# Patient Record
Sex: Female | Born: 1965
Health system: Southern US, Community
[De-identification: ages and names within clinical notes are randomized; demographics above are authoritative.]

## PROBLEM LIST (undated history)

## (undated) DIAGNOSIS — R87613 High grade squamous intraepithelial lesion on cytologic smear of cervix (HGSIL): Secondary | ICD-10-CM

## (undated) DIAGNOSIS — Z800 Family history of malignant neoplasm of digestive organs: Secondary | ICD-10-CM

## (undated) DIAGNOSIS — N879 Dysplasia of cervix uteri, unspecified: Secondary | ICD-10-CM

## (undated) DIAGNOSIS — O020 Blighted ovum and nonhydatidiform mole: Secondary | ICD-10-CM

## (undated) DIAGNOSIS — A6000 Herpesviral infection of urogenital system, unspecified: Secondary | ICD-10-CM

## (undated) DIAGNOSIS — G43909 Migraine, unspecified, not intractable, without status migrainosus: Secondary | ICD-10-CM

## (undated) DIAGNOSIS — Z1371 Encounter for nonprocreative screening for genetic disease carrier status: Secondary | ICD-10-CM

## (undated) DIAGNOSIS — J45909 Unspecified asthma, uncomplicated: Secondary | ICD-10-CM

## (undated) DIAGNOSIS — K219 Gastro-esophageal reflux disease without esophagitis: Secondary | ICD-10-CM

## (undated) DIAGNOSIS — I341 Nonrheumatic mitral (valve) prolapse: Secondary | ICD-10-CM

## (undated) DIAGNOSIS — Z9289 Personal history of other medical treatment: Secondary | ICD-10-CM

## (undated) HISTORY — PX: CERVICAL CERCLAGE: SHX1329

## (undated) HISTORY — DX: Migraine, unspecified, not intractable, without status migrainosus: G43.909

## (undated) HISTORY — DX: High grade squamous intraepithelial lesion on cytologic smear of cervix (HGSIL): R87.613

## (undated) HISTORY — DX: Family history of malignant neoplasm of digestive organs: Z80.0

## (undated) HISTORY — DX: Unspecified asthma, uncomplicated: J45.909

## (undated) HISTORY — DX: Gastro-esophageal reflux disease without esophagitis: K21.9

## (undated) HISTORY — DX: Nonrheumatic mitral (valve) prolapse: I34.1

## (undated) HISTORY — PX: DILATION AND CURETTAGE OF UTERUS: SHX78

## (undated) HISTORY — DX: Herpesviral infection of urogenital system, unspecified: A60.00

## (undated) HISTORY — DX: Blighted ovum and nonhydatidiform mole: O02.0

## (undated) HISTORY — DX: Dysplasia of cervix uteri, unspecified: N87.9

## (undated) HISTORY — DX: Encounter for nonprocreative screening for genetic disease carrier status: Z13.71

## (undated) HISTORY — PX: TUBAL LIGATION: SHX77

## (undated) HISTORY — DX: Personal history of other medical treatment: Z92.89

---

## 1988-07-23 HISTORY — PX: SPINAL FUSION: SHX223

## 1998-07-23 DIAGNOSIS — R87613 High grade squamous intraepithelial lesion on cytologic smear of cervix (HGSIL): Secondary | ICD-10-CM

## 1998-07-23 HISTORY — PX: COLPOSCOPY: SHX161

## 1998-07-23 HISTORY — DX: High grade squamous intraepithelial lesion on cytologic smear of cervix (HGSIL): R87.613

## 1999-07-24 HISTORY — PX: CERVICAL BIOPSY  W/ LOOP ELECTRODE EXCISION: SUR135

## 2004-05-31 ENCOUNTER — Ambulatory Visit: Payer: Self-pay | Admitting: Unknown Physician Specialty

## 2008-08-17 ENCOUNTER — Ambulatory Visit: Payer: Self-pay | Admitting: Unknown Physician Specialty

## 2009-07-23 HISTORY — PX: COLONOSCOPY: SHX174

## 2009-08-18 ENCOUNTER — Ambulatory Visit: Payer: Self-pay | Admitting: Cardiology

## 2009-08-29 ENCOUNTER — Ambulatory Visit: Payer: Self-pay | Admitting: Unknown Physician Specialty

## 2009-10-04 ENCOUNTER — Ambulatory Visit: Payer: Self-pay | Admitting: Unknown Physician Specialty

## 2010-10-10 ENCOUNTER — Ambulatory Visit: Payer: Self-pay | Admitting: Unknown Physician Specialty

## 2011-07-24 DIAGNOSIS — Z800 Family history of malignant neoplasm of digestive organs: Secondary | ICD-10-CM

## 2011-07-24 HISTORY — DX: Family history of malignant neoplasm of digestive organs: Z80.0

## 2011-10-18 ENCOUNTER — Ambulatory Visit: Payer: Self-pay

## 2012-11-06 ENCOUNTER — Ambulatory Visit: Payer: Self-pay | Admitting: Obstetrics and Gynecology

## 2013-11-26 ENCOUNTER — Ambulatory Visit: Payer: Self-pay | Admitting: Obstetrics and Gynecology

## 2014-10-29 ENCOUNTER — Ambulatory Visit
Admit: 2014-10-29 | Disposition: A | Discharge status: Home / Self Care | Payer: Self-pay | Attending: Unknown Physician Specialty | Admitting: Unknown Physician Specialty

## 2014-11-05 ENCOUNTER — Other Ambulatory Visit: Payer: Self-pay | Admitting: Obstetrics and Gynecology

## 2014-11-05 DIAGNOSIS — Z1231 Encounter for screening mammogram for malignant neoplasm of breast: Secondary | ICD-10-CM

## 2014-12-16 ENCOUNTER — Ambulatory Visit
Admission: RE | Admit: 2014-12-16 | Discharge: 2014-12-16 | Disposition: A | Discharge status: Home / Self Care | Payer: 59 | Source: Ambulatory Visit | Attending: Obstetrics and Gynecology | Admitting: Obstetrics and Gynecology

## 2014-12-16 ENCOUNTER — Other Ambulatory Visit: Payer: Self-pay | Admitting: Obstetrics and Gynecology

## 2014-12-16 DIAGNOSIS — Z1231 Encounter for screening mammogram for malignant neoplasm of breast: Secondary | ICD-10-CM | POA: Insufficient documentation

## 2014-12-16 DIAGNOSIS — Z1239 Encounter for other screening for malignant neoplasm of breast: Secondary | ICD-10-CM

## 2014-12-23 DIAGNOSIS — Z9289 Personal history of other medical treatment: Secondary | ICD-10-CM

## 2014-12-23 HISTORY — DX: Personal history of other medical treatment: Z92.89

## 2014-12-23 LAB — HM PAP SMEAR

## 2015-09-02 DIAGNOSIS — H5213 Myopia, bilateral: Secondary | ICD-10-CM | POA: Diagnosis not present

## 2015-09-02 DIAGNOSIS — H52221 Regular astigmatism, right eye: Secondary | ICD-10-CM | POA: Diagnosis not present

## 2015-09-02 DIAGNOSIS — H524 Presbyopia: Secondary | ICD-10-CM | POA: Diagnosis not present

## 2016-01-16 ENCOUNTER — Other Ambulatory Visit: Payer: Self-pay | Admitting: Obstetrics and Gynecology

## 2016-02-02 DIAGNOSIS — Z01419 Encounter for gynecological examination (general) (routine) without abnormal findings: Secondary | ICD-10-CM | POA: Diagnosis not present

## 2016-02-02 DIAGNOSIS — Z315 Encounter for genetic counseling: Secondary | ICD-10-CM | POA: Diagnosis not present

## 2016-02-02 DIAGNOSIS — Z1239 Encounter for other screening for malignant neoplasm of breast: Secondary | ICD-10-CM | POA: Diagnosis not present

## 2016-02-02 DIAGNOSIS — Z8742 Personal history of other diseases of the female genital tract: Secondary | ICD-10-CM | POA: Diagnosis not present

## 2016-02-06 ENCOUNTER — Other Ambulatory Visit: Payer: Self-pay | Admitting: Obstetrics and Gynecology

## 2016-02-06 DIAGNOSIS — Z1231 Encounter for screening mammogram for malignant neoplasm of breast: Secondary | ICD-10-CM

## 2016-02-22 ENCOUNTER — Other Ambulatory Visit: Payer: Self-pay | Admitting: Obstetrics and Gynecology

## 2016-02-22 ENCOUNTER — Ambulatory Visit
Admission: RE | Admit: 2016-02-22 | Discharge: 2016-02-22 | Disposition: A | Discharge status: Home / Self Care | Payer: 59 | Source: Ambulatory Visit | Attending: Obstetrics and Gynecology | Admitting: Obstetrics and Gynecology

## 2016-02-22 DIAGNOSIS — Z1231 Encounter for screening mammogram for malignant neoplasm of breast: Secondary | ICD-10-CM | POA: Insufficient documentation

## 2016-02-22 LAB — HM MAMMOGRAPHY

## 2016-04-05 DIAGNOSIS — Z0000 Encounter for general adult medical examination without abnormal findings: Secondary | ICD-10-CM | POA: Diagnosis not present

## 2016-04-05 DIAGNOSIS — K219 Gastro-esophageal reflux disease without esophagitis: Secondary | ICD-10-CM | POA: Diagnosis not present

## 2016-04-05 DIAGNOSIS — Z800 Family history of malignant neoplasm of digestive organs: Secondary | ICD-10-CM | POA: Diagnosis not present

## 2016-04-05 DIAGNOSIS — G43109 Migraine with aura, not intractable, without status migrainosus: Secondary | ICD-10-CM | POA: Diagnosis not present

## 2016-07-23 HISTORY — PX: REFRACTIVE SURGERY: SHX103

## 2017-02-04 ENCOUNTER — Ambulatory Visit: Payer: Self-pay | Admitting: Obstetrics and Gynecology

## 2017-02-05 ENCOUNTER — Encounter: Payer: Self-pay | Admitting: Obstetrics and Gynecology

## 2017-02-05 ENCOUNTER — Ambulatory Visit (INDEPENDENT_AMBULATORY_CARE_PROVIDER_SITE_OTHER): Payer: 59 | Admitting: Obstetrics and Gynecology

## 2017-02-05 VITALS — BP 100/64 | HR 70 | Ht 60.0 in | Wt 127.0 lb

## 2017-02-05 DIAGNOSIS — Z1151 Encounter for screening for human papillomavirus (HPV): Secondary | ICD-10-CM | POA: Diagnosis not present

## 2017-02-05 DIAGNOSIS — A6000 Herpesviral infection of urogenital system, unspecified: Secondary | ICD-10-CM | POA: Insufficient documentation

## 2017-02-05 DIAGNOSIS — A6004 Herpesviral vulvovaginitis: Secondary | ICD-10-CM | POA: Diagnosis not present

## 2017-02-05 DIAGNOSIS — Z124 Encounter for screening for malignant neoplasm of cervix: Secondary | ICD-10-CM | POA: Diagnosis not present

## 2017-02-05 DIAGNOSIS — Z01419 Encounter for gynecological examination (general) (routine) without abnormal findings: Secondary | ICD-10-CM | POA: Diagnosis not present

## 2017-02-05 DIAGNOSIS — Z1231 Encounter for screening mammogram for malignant neoplasm of breast: Secondary | ICD-10-CM

## 2017-02-05 DIAGNOSIS — Z1239 Encounter for other screening for malignant neoplasm of breast: Secondary | ICD-10-CM

## 2017-02-05 MED ORDER — ACYCLOVIR 400 MG PO TABS: 400.0000 mg | ORAL_TABLET | Freq: Three times a day (TID) | ORAL | 0 refills | Status: DC

## 2017-02-05 NOTE — Progress Notes (Signed)
Chief Complaint  Patient presents with  . Gynecologic Exam     HPI:      Meghan Green is a 51 y.o. [id number] who LMP was Patient's last menstrual period was 01/18/2017., presents today for her annual examination.  Her menses are Q3-4 wks, lasting 3 days.  Dysmenorrhea mild, occurring first 1-2 days of flow. She does not have intermenstrual bleeding.  Sex activity: single partner, contraception - tubal ligation.  Last Pap: December 23, 2014  Results were: no abnormalities /neg HPV DNA . 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 Hx of STDs: HSV, HPV. She takes acyclovir prn outbreaks which are rare.   Last mammogram: February 22, 2016  Results were: normal--routine follow-up in 12 months There is no FH of breast cancer. There is no FH of ovarian cancer. The patient does do self-breast exams. She has a strong FH of colon cancer in 3rd degree relatives on mat and pat side. She gets colonoscopies Q73yrs with Dr. Mechele Collin.  Tobacco use: The patient denies current or previous tobacco use. Alcohol use: none Exercise: moderately active  She does get adequate calcium and Vitamin D in her diet.  She has labs with PCP.   Past Medical History:  Diagnosis Date  . Asthma   . Blighted ovum    X48  . Cervical dysplasia    CIN II  . Family history of colon cancer 2013   STRONG FH ON BOTH SIDES. COLARIS TESTING REC. PT NEVER CAME FOR LAB DRAW  . GERD (gastroesophageal reflux disease)   . Herpes, genital   . History of mammogram 12/16/14; 02/22/16   BIRAD 1; NEG  . History of Papanicolaou smear of cervix 12/23/2014   -/-  . Migraine   . Mitral valve prolapse   . Pap smear abnormality of cervix with HGSIL 2000    Past Surgical History:  Procedure Laterality Date  . CERVICAL BIOPSY  W/ LOOP ELECTRODE EXCISION  2001   mod dysp  . CERVICAL CERCLAGE     x2  . CESAREAN SECTION  2000  . COLONOSCOPY  2011   wnl; repeat due 2015 c Dr. Mechele Collin  . COLPOSCOPY  2000  . DILATION AND CURETTAGE OF UTERUS     x2  . REFRACTIVE SURGERY  07/2016   Right eye  . SPINAL FUSION  1990  . TUBAL LIGATION      Family History  Problem Relation Age of Onset  . Cancer Other 60       4-5 [facility]GA/[facility] COLON  . Cancer Other 43       COLON    Social History   Social History  . Marital status: Married    Spouse name: N/A  . Number of children: 2  . Years of education: 14   Occupational History  . Not on file.   Social History Main Topics  . Smoking status: Never Smoker  . Smokeless tobacco: Never Used  . Alcohol use No  . Drug use: No  . Sexual activity: Yes    Birth control/ protection: Surgical     Comment: TUBAL LIGATION   Other Topics Concern  . Not on file   Social History Narrative  . No narrative on file     Current Outpatient Prescriptions:  .  acetaminophen (TYLENOL) 325 MG tablet, Take 650 mg by mouth every 4 (four) hours as needed., Disp: , Rfl:  .  acyclovir (ZOVIRAX) 400 MG tablet, Take 1 tablet (400 mg total) by  mouth 3 (three) times daily. FOR 5 DAYS PRN SXS, Disp: 30 tablet, Rfl: 0 .  ibuprofen (ADVIL,MOTRIN) 200 MG tablet, Take 200 mg by mouth every 6 (six) hours as needed., Disp: , Rfl:  .  Multiple Vitamin (MULTIVITAMIN) capsule, Take 1 capsule by mouth daily., Disp: , Rfl:  .  naproxen sodium (ANAPROX) 220 MG tablet, Take 1 tablet by mouth as needed., Disp: , Rfl:  .  omeprazole (PRILOSEC) 20 MG capsule, Take 1 capsule by mouth daily., Disp: , Rfl:   ROS:  Review of Systems  Constitutional: Negative for fatigue, fever and unexpected weight change.  Respiratory: Negative for cough, shortness of breath and wheezing.   Cardiovascular: Negative for chest pain, palpitations and leg swelling.  Gastrointestinal: Negative for blood in stool, constipation, diarrhea, nausea and vomiting.  Endocrine: Negative for cold intolerance, heat intolerance and polyuria.  Genitourinary: Negative for dyspareunia, dysuria, flank pain, frequency, genital sores, hematuria, menstrual problem,  pelvic pain, urgency, vaginal bleeding, vaginal discharge and vaginal pain.  Musculoskeletal: Negative for back pain, joint swelling and myalgias.  Skin: Negative for rash.  Neurological: Positive for headaches. Negative for dizziness, syncope, light-headedness and numbness.  Hematological: Negative for adenopathy.  Psychiatric/Behavioral: Negative for agitation, confusion, sleep disturbance and suicidal ideas. The patient is not nervous/anxious.      Objective: BP 100/64 (BP Location: Left Arm, Patient Position: Sitting, Cuff Size: Normal)   Pulse 70   Ht 5' (1.524 m)   Wt 127 lb (57.6 kg)   LMP 01/18/2017   BMI 24.80 kg/m    Physical Exam  Constitutional: She is oriented to person, place, and time. She appears well-developed and well-nourished.  Genitourinary: Vagina normal and uterus normal. There is no rash or tenderness on the right labia. There is no rash or tenderness on the left labia. No erythema or tenderness in the vagina. No vaginal discharge found. Right adnexum does not display mass and does not display tenderness. Left adnexum does not display mass and does not display tenderness. Cervix does not exhibit motion tenderness or polyp. Uterus is not enlarged or tender.  Neck: Normal range of motion. No thyromegaly present.  Cardiovascular: Normal rate, regular rhythm and normal heart sounds.   No murmur heard. Pulmonary/Chest: Effort normal and breath sounds normal. Right breast exhibits no mass, no nipple discharge, no skin change and no tenderness. Left breast exhibits no mass, no nipple discharge, no skin change and no tenderness.  Abdominal: Soft. There is no tenderness. There is no guarding.  Musculoskeletal: Normal range of motion.  Neurological: She is alert and oriented to person, place, and time. No cranial nerve deficit.  Psychiatric: She has a normal mood and affect. Her behavior is normal.  Vitals reviewed.   Assessment/Plan: Encounter for annual routine  gynecological examination  Screening for breast cancer - [facility] - Plan: MM DIGITAL SCREENING BILATERAL, IGP, Aptima HPV  Cervical cancer screening - Plan: IGP, Aptima HPV  Screening for HPV (human papillomavirus)  Herpes simplex vulvovaginitis - Rx acyclovir prn sx. Rarely has outbreak. - Plan: acyclovir (ZOVIRAX) 400 MG tablet             GYN counsel mammography screening, adequate intake of calcium and vitamin D, diet and exercise     F/U  Return in about 1 year (around 02/05/2018).  Medora Roorda B. Jodey Burbano, PA-C 02/05/2017 9:32 AM

## 2017-02-09 LAB — IGP, APTIMA HPV
HPV Aptima: NEGATIVE
PAP SMEAR COMMENT: 0

## 2017-02-28 ENCOUNTER — Ambulatory Visit
Admission: RE | Admit: 2017-02-28 | Discharge: 2017-02-28 | Disposition: A | Discharge status: Home / Self Care | Payer: 59 | Source: Ambulatory Visit | Attending: Obstetrics and Gynecology | Admitting: Obstetrics and Gynecology

## 2017-02-28 DIAGNOSIS — Z1231 Encounter for screening mammogram for malignant neoplasm of breast: Secondary | ICD-10-CM | POA: Diagnosis not present

## 2017-02-28 DIAGNOSIS — Z1239 Encounter for other screening for malignant neoplasm of breast: Secondary | ICD-10-CM

## 2017-03-01 ENCOUNTER — Encounter: Payer: Self-pay | Admitting: Obstetrics and Gynecology

## 2017-03-07 ENCOUNTER — Other Ambulatory Visit: Payer: Self-pay | Admitting: Obstetrics and Gynecology

## 2017-03-07 DIAGNOSIS — A6004 Herpesviral vulvovaginitis: Secondary | ICD-10-CM

## 2017-03-08 ENCOUNTER — Encounter: Payer: Self-pay | Admitting: Obstetrics and Gynecology

## 2017-03-08 ENCOUNTER — Other Ambulatory Visit: Payer: Self-pay | Admitting: Obstetrics and Gynecology

## 2017-03-08 DIAGNOSIS — A6004 Herpesviral vulvovaginitis: Secondary | ICD-10-CM

## 2017-03-08 MED ORDER — ACYCLOVIR 400 MG PO TABS: 400.0000 mg | ORAL_TABLET | Freq: Three times a day (TID) | ORAL | 0 refills | Status: DC

## 2017-04-09 DIAGNOSIS — G43109 Migraine with aura, not intractable, without status migrainosus: Secondary | ICD-10-CM | POA: Diagnosis not present

## 2017-04-09 DIAGNOSIS — G4726 Circadian rhythm sleep disorder, shift work type: Secondary | ICD-10-CM | POA: Diagnosis not present

## 2017-04-09 DIAGNOSIS — Z0000 Encounter for general adult medical examination without abnormal findings: Secondary | ICD-10-CM | POA: Diagnosis not present

## 2017-04-09 DIAGNOSIS — R0789 Other chest pain: Secondary | ICD-10-CM | POA: Diagnosis not present

## 2018-01-03 DIAGNOSIS — H5212 Myopia, left eye: Secondary | ICD-10-CM | POA: Diagnosis not present

## 2018-01-20 DIAGNOSIS — Z1371 Encounter for nonprocreative screening for genetic disease carrier status: Secondary | ICD-10-CM

## 2018-01-20 HISTORY — DX: Encounter for nonprocreative screening for genetic disease carrier status: Z13.71

## 2018-02-10 ENCOUNTER — Ambulatory Visit (INDEPENDENT_AMBULATORY_CARE_PROVIDER_SITE_OTHER): Payer: 59 | Admitting: Obstetrics and Gynecology

## 2018-02-10 ENCOUNTER — Encounter: Payer: Self-pay | Admitting: Family Medicine

## 2018-02-10 ENCOUNTER — Encounter: Payer: Self-pay | Admitting: Obstetrics and Gynecology

## 2018-02-10 VITALS — BP 122/70 | HR 67 | Ht 60.0 in | Wt 116.5 lb

## 2018-02-10 DIAGNOSIS — Z1239 Encounter for other screening for malignant neoplasm of breast: Secondary | ICD-10-CM

## 2018-02-10 DIAGNOSIS — Z1231 Encounter for screening mammogram for malignant neoplasm of breast: Secondary | ICD-10-CM | POA: Diagnosis not present

## 2018-02-10 DIAGNOSIS — Z1321 Encounter for screening for nutritional disorder: Secondary | ICD-10-CM | POA: Diagnosis not present

## 2018-02-10 DIAGNOSIS — Z800 Family history of malignant neoplasm of digestive organs: Secondary | ICD-10-CM | POA: Diagnosis not present

## 2018-02-10 DIAGNOSIS — Z01411 Encounter for gynecological examination (general) (routine) with abnormal findings: Secondary | ICD-10-CM | POA: Diagnosis not present

## 2018-02-10 DIAGNOSIS — Z01419 Encounter for gynecological examination (general) (routine) without abnormal findings: Secondary | ICD-10-CM

## 2018-02-10 DIAGNOSIS — A6004 Herpesviral vulvovaginitis: Secondary | ICD-10-CM

## 2018-02-10 MED ORDER — ACYCLOVIR 400 MG PO TABS: 400.0000 mg | ORAL_TABLET | Freq: Three times a day (TID) | ORAL | 1 refills | Status: DC

## 2018-02-10 NOTE — Progress Notes (Signed)
Chief Complaint  Patient presents with  . Gynecologic Exam     HPI:      [name] is a 52 y.o. [id number] who LMP was Patient's last menstrual period was 01/11/2018 (exact date)., presents today for her annual examination.  Her menses are Q3-4 wks, lasting 3 days.  Dysmenorrhea mild, occurring day before flow. She does not have intermenstrual bleeding.  Sex activity: single partner, contraception - tubal ligation.  Last Pap: 02/05/17  Results were: no abnormalities /neg HPV DNA . 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 Hx of STDs: HSV, HPV. She takes acyclovir prn outbreaks which are rare. She needs Rx RF.  Last mammogram: February 28, 2017  Results were: normal--routine follow-up in 12 months There is no FH of breast cancer. There is no FH of ovarian cancer. The patient does do self-breast exams. She has a strong FH of colon cancer in 3rd degree relatives on mat and pat side. She gets colonoscopies Q6467yrs with Dr. Mechele CollinElliott and is due soon. Genetic testing discussed in past but not done due to ins coverage.   Tobacco use: The patient denies current or previous tobacco use. Alcohol use: none Exercise: moderately active  She does get adequate calcium but not Vitamin D in her diet.  She has labs with PCP.   Past Medical History:  Diagnosis Date  . Asthma   . Blighted ovum    59X2  . Cervical dysplasia    CIN II  . Family history of colon cancer 2013   STRONG FH ON BOTH SIDES. COLARIS TESTING REC. PT NEVER CAME FOR LAB DRAW  . GERD (gastroesophageal reflux disease)   . Herpes, genital   . History of mammogram 12/16/14; 02/22/16   BIRAD 1; NEG  . History of Papanicolaou smear of cervix 12/23/2014   -/-  . Migraine   . Mitral valve prolapse   . Pap smear abnormality of cervix with HGSIL 2000    Past Surgical History:  Procedure Laterality Date  . CERVICAL BIOPSY  W/ LOOP ELECTRODE EXCISION  2001   mod dysp  . CERVICAL CERCLAGE     x2  . CESAREAN SECTION  2000  .  COLONOSCOPY  2011   wnl; repeat due 2015 c Dr. Mechele CollinElliott  . COLPOSCOPY  2000  . DILATION AND CURETTAGE OF UTERUS     x2  . REFRACTIVE SURGERY  07/2016   Right eye  . SPINAL FUSION  1990  . TUBAL LIGATION      Family History  Problem Relation Age of Onset  . Colon cancer Other 51       3 mat relatives  . Colon cancer Other 70       COLON  . Breast cancer Neg Hx     Social History   Socioeconomic History  . Marital status: Married    Spouse name: Not on file  . Number of children: 2  . Years of education: 7014  . Highest education level: Not on file  Occupational History  . Not on file  Social Needs  . Financial resource strain: Not on file  . Food insecurity:    Worry: Not on file    Inability: Not on file  . Transportation needs:    Medical: Not on file    Non-medical: Not on file  Tobacco Use  . Smoking status: Never Smoker  . Smokeless tobacco: Never Used  Substance and Sexual Activity  . Alcohol use: No  .  Drug use: No  . Sexual activity: Yes    Birth control/protection: Surgical    Comment: TUBAL LIGATION  Lifestyle  . Physical activity:    Days per week: Not on file    Minutes per session: Not on file  . Stress: Not on file  Relationships  . Social connections:    Talks on phone: Not on file    Gets together: Not on file    Attends religious service: Not on file    Active member of club or organization: Not on file    Attends meetings of clubs or organizations: Not on file    Relationship status: Not on file  . Intimate partner violence:    Fear of current or ex partner: Not on file    Emotionally abused: Not on file    Physically abused: Not on file    Forced sexual activity: Not on file  Other Topics Concern  . Not on file  Social History Narrative  . Not on file    Current Outpatient Medications on File Prior to Visit  Medication Sig Dispense Refill  . aspirin-acetaminophen-caffeine (EXCEDRIN MIGRAINE) 250-250-65 MG tablet     .  bimatoprost (LATISSE) 0.03 % ophthalmic solution   6  . ibuprofen (ADVIL,MOTRIN) 200 MG tablet Take 200 mg by mouth every 6 (six) hours as needed.    . Multiple Vitamin (MULTIVITAMIN) capsule Take 1 capsule by mouth daily.    . naproxen sodium (ANAPROX) 220 MG tablet Take 1 tablet by mouth as needed.    Marland Kitchen omeprazole (PRILOSEC) 20 MG capsule Take 1 capsule by mouth daily.     No current facility-administered medications on file prior to visit.     ROS:  Review of Systems  Constitutional: Negative for fatigue, fever and unexpected weight change.  Respiratory: Negative for cough, shortness of breath and wheezing.   Cardiovascular: Negative for chest pain, palpitations and leg swelling.  Gastrointestinal: Negative for blood in stool, constipation, diarrhea, nausea and vomiting.  Endocrine: Negative for cold intolerance, heat intolerance and polyuria.  Genitourinary: Negative for dyspareunia, dysuria, flank pain, frequency, genital sores, hematuria, menstrual problem, pelvic pain, urgency, vaginal bleeding, vaginal discharge and vaginal pain.  Musculoskeletal: Negative for back pain, joint swelling and myalgias.  Skin: Negative for rash.  Neurological: Negative for dizziness, syncope, light-headedness, numbness and headaches.  Hematological: Negative for adenopathy.  Psychiatric/Behavioral: Negative for agitation, confusion, sleep disturbance and suicidal ideas. The patient is not nervous/anxious.      Objective: BP 122/70   Pulse 67   Ht 5' (1.524 m)   Wt 116 lb 8 oz (52.8 kg)   LMP 01/11/2018 (Exact Date)   BMI 22.75 kg/m    Physical Exam  Constitutional: She is oriented to person, place, and time. She appears well-developed and well-nourished.  Genitourinary: Vagina normal and uterus normal. There is no rash or tenderness on the right labia. There is no rash or tenderness on the left labia. No erythema or tenderness in the vagina. No vaginal discharge found. Right adnexum does not  display mass and does not display tenderness. Left adnexum does not display mass and does not display tenderness. Cervix does not exhibit motion tenderness or polyp. Uterus is not enlarged or tender.  Neck: Normal range of motion. No thyromegaly present.  Cardiovascular: Normal rate, regular rhythm and normal heart sounds.  No murmur heard. Pulmonary/Chest: Effort normal and breath sounds normal. Right breast exhibits no mass, no nipple discharge, no skin change and no tenderness. Left  breast exhibits no mass, no nipple discharge, no skin change and no tenderness.  Abdominal: Soft. There is no tenderness. There is no guarding.  Musculoskeletal: Normal range of motion.  Neurological: She is alert and oriented to person, place, and time. No cranial nerve deficit.  Psychiatric: She has a normal mood and affect. Her behavior is normal.  Vitals reviewed.   Assessment/Plan: Encounter for annual routine gynecological examination  Screening for breast cancer - Pt to sched mammo. - Plan: MM DIGITAL SCREENING BILATERAL  Family history of colon cancer - MyRisk testing discussed and done today. Will call with results. Getting colonoscopies Q5 yrs and due soon per pt. Pt to call [facility]. Will do ref prn - Plan: [facility]  Encounter for vitamin deficiency screening - Plan: VITAMIN D 25 Hydroxy (Vit-D Deficiency, Fractures)  Herpes simplex vulvovaginitis - Rx acyclovir prn sx. Rarely has outbreak.  Meds ordered this encounter  Medications  . acyclovir (ZOVIRAX) 400 MG tablet    Sig: Take 1 tablet (400 mg total) by mouth 3 (three) times daily. FOR 5 DAYS PRN SXS    Dispense:  30 tablet    Refill:  1    Order Specific Question:   Supervising Provider    Answer:   Nadara Mustard [[id number]]             GYN counsel breast self exam, mammography screening, adequate intake of calcium and vitamin D, diet and exercise     F/U  Return in about 1 year (around 02/11/2019).  Alicia B. Copland,  PA-C 02/10/2018 9:36 AM

## 2018-02-10 NOTE — Patient Instructions (Addendum)
I value your feedback and entrusting us with your care. If you get a [facility] patient survey, I would appreciate you taking the time to let us know about your experience today. Thank you!  [facility]: [phone]    

## 2018-02-11 LAB — VITAMIN D 25 HYDROXY (VIT D DEFICIENCY, FRACTURES): VIT D 25 HYDROXY: 37.2 ng/mL (ref 30.0–100.0)

## 2018-03-13 ENCOUNTER — Ambulatory Visit
Admission: RE | Admit: 2018-03-13 | Discharge: 2018-03-13 | Disposition: A | Discharge status: Home / Self Care | Payer: 59 | Source: Ambulatory Visit | Attending: Obstetrics and Gynecology | Admitting: Obstetrics and Gynecology

## 2018-03-13 ENCOUNTER — Encounter: Payer: Self-pay | Admitting: Obstetrics and Gynecology

## 2018-03-13 DIAGNOSIS — Z1239 Encounter for other screening for malignant neoplasm of breast: Secondary | ICD-10-CM

## 2018-03-13 DIAGNOSIS — Z1231 Encounter for screening mammogram for malignant neoplasm of breast: Secondary | ICD-10-CM | POA: Insufficient documentation

## 2018-03-17 ENCOUNTER — Encounter: Payer: Self-pay | Admitting: Obstetrics and Gynecology

## 2018-03-25 ENCOUNTER — Telehealth: Payer: Self-pay | Admitting: Obstetrics and Gynecology

## 2018-03-25 ENCOUNTER — Encounter: Payer: Self-pay | Admitting: Obstetrics and Gynecology

## 2018-03-25 NOTE — Telephone Encounter (Signed)
Patient is calling for lab results. Patient works third shift and will be up around 4:45 if you need to speak with patient. Patient states it is ok to leave an detailed voicemail

## 2018-03-26 ENCOUNTER — Telehealth: Payer: Self-pay | Admitting: Obstetrics and Gynecology

## 2018-03-26 NOTE — Telephone Encounter (Signed)
LMTRC

## 2018-03-26 NOTE — Telephone Encounter (Signed)
Pt aware of neg MyRisk results, except APC VUS. Riskscore=9.6%. Strong FH colon cancer. No extra screening needed. Followed by Dr. Mechele Collin.   Patient understands these results only apply to her and her children, and this is not indicative of genetic testing results of her other family members. It is recommended that her other family members have genetic testing done.  Pt also understands negative genetic testing doesn't mean she will never get any of these cancers.   Hard copy mailed to pt. F/u prn.

## 2018-03-26 NOTE — Telephone Encounter (Signed)
Done

## 2018-03-26 NOTE — Telephone Encounter (Signed)
Patient is returning missed call. Please advise 

## 2018-04-15 DIAGNOSIS — Z79899 Other long term (current) drug therapy: Secondary | ICD-10-CM | POA: Diagnosis not present

## 2018-04-15 DIAGNOSIS — K219 Gastro-esophageal reflux disease without esophagitis: Secondary | ICD-10-CM | POA: Diagnosis not present

## 2018-04-15 DIAGNOSIS — G43109 Migraine with aura, not intractable, without status migrainosus: Secondary | ICD-10-CM | POA: Diagnosis not present

## 2018-04-15 DIAGNOSIS — Z0000 Encounter for general adult medical examination without abnormal findings: Secondary | ICD-10-CM | POA: Diagnosis not present

## 2018-08-05 ENCOUNTER — Other Ambulatory Visit: Payer: Self-pay | Admitting: Obstetrics and Gynecology

## 2018-08-05 NOTE — Telephone Encounter (Signed)
Please advise 

## 2019-01-09 DIAGNOSIS — H5212 Myopia, left eye: Secondary | ICD-10-CM | POA: Diagnosis not present

## 2019-02-16 NOTE — Progress Notes (Signed)
Chief Complaint  Patient presents with  . Gynecologic Exam     HPI:      [name] is a 53 y.o. [id number] who LMP was Patient's last menstrual period was 01/30/2019 (exact date)., presents today for her annual examination.  Her menses are Q3-4 wks, lasting 3 days.  Dysmenorrhea none. She does not have intermenstrual bleeding. No vasomotor sx. Having increased menstrual migraines, which is common in her family until menopause.   Sex activity: single partner, contraception - tubal ligation.  Last Pap: 02/05/17  Results were: no abnormalities /neg HPV DNA . 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 Hx of STDs: HSV, HPV. She takes acyclovir prn outbreaks which are rare. She needs Rx RF.  Last mammogram: March 13, 2018  Results were: normal--routine follow-up in 12 months There is no FH of breast cancer. There is no FH of ovarian cancer. The patient does do self-breast exams. She has a strong FH of colon cancer in 3rd degree relatives on mat and pat side. She gets colonoscopies Q51yr with Dr. EVira Agarand is due 2021. MyRisk neg except APC VUS 2019. Riskscore=9.6%.  Tobacco use: The patient denies current or previous tobacco use. Alcohol use: none Drug use: none Exercise: moderately active  She does get adequate calcium and Vitamin D in her diet.  She has labs with PCP.   Past Medical History:  Diagnosis Date  . Asthma   . Blighted ovum    XX33 . BRCA negative 01/2018   MyRisk neg except APC VUS; IBIS=9.7%, riskscore=9.6%  . Cervical dysplasia    CIN II  . Family history of colon cancer 2013   STRONG FH ON BOTH SIDES. COLARIS TESTING REC. PT NEVER CAME FOR LAB DRAW  . GERD (gastroesophageal reflux disease)   . Herpes, genital   . History of mammogram 12/16/14; 02/22/16   BIRAD 1; NEG  . History of Papanicolaou smear of cervix 12/23/2014   -/-  . Migraine   . Mitral valve prolapse   . Pap smear abnormality of cervix with HGSIL 2000    Past Surgical History:   Procedure Laterality Date  . CERVICAL BIOPSY  W/ LOOP ELECTRODE EXCISION  2001   mod dysp  . CERVICAL CERCLAGE     x2  . CESAREAN SECTION  2000  . COLONOSCOPY  2011   wnl; repeat due 2015 c Dr. EVira Agar . COLPOSCOPY  2000  . DILATION AND CURETTAGE OF UTERUS     x2  . REFRACTIVE SURGERY  07/2016   Right eye  . SPINAL FUSION  1990  . TUBAL LIGATION      Family History  Problem Relation Age of Onset  . Colon cancer Other 573      3 mat relatives  . Colon cancer Other 70       COLON  . Breast cancer Neg Hx     Social History   Socioeconomic History  . Marital status: Married    Spouse name: Not on file  . Number of children: 2  . Years of education: 156 . Highest education level: Not on file  Occupational History  . Not on file  Social Needs  . Financial resource strain: Not on file  . Food insecurity    Worry: Not on file    Inability: Not on file  . Transportation needs    Medical: Not on file    Non-medical: Not on file  Tobacco Use  .  Smoking status: Never Smoker  . Smokeless tobacco: Never Used  Substance and Sexual Activity  . Alcohol use: No  . Drug use: No  . Sexual activity: Yes    Birth control/protection: Surgical    Comment: TUBAL LIGATION  Lifestyle  . Physical activity    Days per week: Not on file    Minutes per session: Not on file  . Stress: Not on file  Relationships  . Social Herbalist on phone: Not on file    Gets together: Not on file    Attends religious service: Not on file    Active member of club or organization: Not on file    Attends meetings of clubs or organizations: Not on file    Relationship status: Not on file  . Intimate partner violence    Fear of current or ex partner: Not on file    Emotionally abused: Not on file    Physically abused: Not on file    Forced sexual activity: Not on file  Other Topics Concern  . Not on file  Social History Narrative  . Not on file    Current Outpatient Medications  on File Prior to Visit  Medication Sig Dispense Refill  . aspirin-acetaminophen-caffeine (EXCEDRIN MIGRAINE) 250-250-65 MG tablet     . bimatoprost (LATISSE) 0.03 % ophthalmic solution   6  . Calcium-Vitamin D-Vitamin K 336-122-44 MG-UNT-MCG CHEW Chew by mouth.    Marland Kitchen ibuprofen (ADVIL,MOTRIN) 200 MG tablet Take 200 mg by mouth every 6 (six) hours as needed.    . Multiple Vitamin (MULTIVITAMIN) capsule Take 1 capsule by mouth daily.    . naproxen sodium (ANAPROX) 220 MG tablet Take 1 tablet by mouth as needed.    Marland Kitchen omeprazole (PRILOSEC) 20 MG capsule Take 1 capsule by mouth daily.     No current facility-administered medications on file prior to visit.     ROS:  Review of Systems  Constitutional: Negative for fatigue, fever and unexpected weight change.  Respiratory: Negative for cough, shortness of breath and wheezing.   Cardiovascular: Negative for chest pain, palpitations and leg swelling.  Gastrointestinal: Negative for blood in stool, constipation, diarrhea, nausea and vomiting.  Endocrine: Negative for cold intolerance, heat intolerance and polyuria.  Genitourinary: Negative for dyspareunia, dysuria, flank pain, frequency, genital sores, hematuria, menstrual problem, pelvic pain, urgency, vaginal bleeding, vaginal discharge and vaginal pain.  Musculoskeletal: Positive for arthralgias. Negative for back pain, joint swelling and myalgias.  Skin: Negative for rash.  Neurological: Positive for headaches. Negative for dizziness, syncope, light-headedness and numbness.  Hematological: Negative for adenopathy.  Psychiatric/Behavioral: Negative for agitation, confusion, sleep disturbance and suicidal ideas. The patient is not nervous/anxious.      Objective: BP 100/78   Ht 5' (1.524 m)   Wt 122 lb 3.2 oz (55.4 kg)   LMP 01/30/2019 (Exact Date)   BMI 23.87 kg/m    Physical Exam Constitutional:      Appearance: She is well-developed.  Genitourinary:     Vulva, vagina, uterus,  right adnexa and left adnexa normal.     No vulval lesion or tenderness noted.     No vaginal discharge, erythema or tenderness.     No cervical motion tenderness or polyp.     Uterus is not enlarged or tender.     No right or left adnexal mass present.     Right adnexa not tender.     Left adnexa not tender.  Neck:  Musculoskeletal: Normal range of motion.     Thyroid: No thyromegaly.  Cardiovascular:     Rate and Rhythm: Normal rate and regular rhythm.     Heart sounds: Normal heart sounds. No murmur.  Pulmonary:     Effort: Pulmonary effort is normal.     Breath sounds: Normal breath sounds.  Chest:     Breasts:        Right: No mass, nipple discharge, skin change or tenderness.        Left: No mass, nipple discharge, skin change or tenderness.  Abdominal:     Palpations: Abdomen is soft.     Tenderness: There is no abdominal tenderness. There is no guarding.  Musculoskeletal: Normal range of motion.  Neurological:     General: No focal deficit present.     Mental Status: She is alert and oriented to person, place, and time.     Cranial Nerves: No cranial nerve deficit.  Skin:    General: Skin is warm and dry.  Psychiatric:        Mood and Affect: Mood normal.        Behavior: Behavior normal.        Thought Content: Thought content normal.        Judgment: Judgment normal.  Vitals signs reviewed.     Assessment/Plan: Encounter for annual routine gynecological examination -   Screening for breast cancer - Plan: MM 3D SCREEN BREAST BILATERAL, Pt to sched mammo  Family history of colon cancer - Plan: Neg MyRisk testing. Getting Q5 yr colonoscopies.  Herpes simplex vulvovaginitis - Plan: acyclovir (ZOVIRAX) 400 MG tablet, Rx RF acyclovir. F/u prn.   Meds ordered this encounter  Medications  . acyclovir (ZOVIRAX) 400 MG tablet    Sig: TAKE 1 TABLET BY MOUTH 3 TIMES DAILY FOR 5 DAYS AS NEEDED FOR SYMPTOMS    Dispense:  30 tablet    Refill:  1    Order Specific  Question:   Supervising Provider    Answer:   Gae Dry [[id number]]             GYN counsel breast self exam, mammography screening, adequate intake of calcium and vitamin D, diet and exercise     F/U  Return in about 1 year (around 02/17/2020).  Lesta Limbert B. Shai Rasmussen, PA-C 02/17/2019 8:50 AM

## 2019-02-17 ENCOUNTER — Ambulatory Visit (INDEPENDENT_AMBULATORY_CARE_PROVIDER_SITE_OTHER): Payer: 59 | Admitting: Obstetrics and Gynecology

## 2019-02-17 ENCOUNTER — Other Ambulatory Visit: Payer: Self-pay

## 2019-02-17 ENCOUNTER — Encounter: Payer: Self-pay | Admitting: Obstetrics and Gynecology

## 2019-02-17 VITALS — BP 100/78 | Ht 60.0 in | Wt 122.2 lb

## 2019-02-17 DIAGNOSIS — A6004 Herpesviral vulvovaginitis: Secondary | ICD-10-CM

## 2019-02-17 DIAGNOSIS — Z01419 Encounter for gynecological examination (general) (routine) without abnormal findings: Secondary | ICD-10-CM | POA: Diagnosis not present

## 2019-02-17 DIAGNOSIS — Z800 Family history of malignant neoplasm of digestive organs: Secondary | ICD-10-CM

## 2019-02-17 DIAGNOSIS — Z1239 Encounter for other screening for malignant neoplasm of breast: Secondary | ICD-10-CM

## 2019-02-17 MED ORDER — ACYCLOVIR 400 MG PO TABS: ORAL_TABLET | ORAL | 1 refills | Status: DC

## 2019-02-17 NOTE — Patient Instructions (Signed)
I value your feedback and entrusting us with your care. If you get a [facility] patient survey, I would appreciate you taking the time to let us know about your experience today. Thank you! 

## 2019-04-17 DIAGNOSIS — G43109 Migraine with aura, not intractable, without status migrainosus: Secondary | ICD-10-CM | POA: Diagnosis not present

## 2019-04-17 DIAGNOSIS — Z23 Encounter for immunization: Secondary | ICD-10-CM | POA: Diagnosis not present

## 2019-04-17 DIAGNOSIS — Z0000 Encounter for general adult medical examination without abnormal findings: Secondary | ICD-10-CM | POA: Diagnosis not present

## 2019-04-17 DIAGNOSIS — K219 Gastro-esophageal reflux disease without esophagitis: Secondary | ICD-10-CM | POA: Diagnosis not present

## 2019-05-25 ENCOUNTER — Encounter: Payer: Self-pay | Admitting: Obstetrics and Gynecology

## 2019-05-25 ENCOUNTER — Ambulatory Visit
Admission: RE | Admit: 2019-05-25 | Discharge: 2019-05-25 | Disposition: A | Discharge status: Home / Self Care | Payer: 59 | Source: Ambulatory Visit | Attending: Obstetrics and Gynecology | Admitting: Obstetrics and Gynecology

## 2019-05-25 DIAGNOSIS — Z1231 Encounter for screening mammogram for malignant neoplasm of breast: Secondary | ICD-10-CM | POA: Insufficient documentation

## 2019-05-25 DIAGNOSIS — Z1239 Encounter for other screening for malignant neoplasm of breast: Secondary | ICD-10-CM

## 2019-07-03 DIAGNOSIS — J22 Unspecified acute lower respiratory infection: Secondary | ICD-10-CM | POA: Diagnosis not present

## 2019-07-03 DIAGNOSIS — G4489 Other headache syndrome: Secondary | ICD-10-CM | POA: Diagnosis not present

## 2019-07-03 DIAGNOSIS — U071 COVID-19: Secondary | ICD-10-CM | POA: Diagnosis not present

## 2020-01-14 DIAGNOSIS — Z01812 Encounter for preprocedural laboratory examination: Secondary | ICD-10-CM | POA: Diagnosis not present

## 2020-01-14 DIAGNOSIS — Z8371 Family history of colonic polyps: Secondary | ICD-10-CM | POA: Diagnosis not present

## 2020-01-14 DIAGNOSIS — H5212 Myopia, left eye: Secondary | ICD-10-CM | POA: Diagnosis not present

## 2020-01-14 DIAGNOSIS — K219 Gastro-esophageal reflux disease without esophagitis: Secondary | ICD-10-CM | POA: Diagnosis not present

## 2020-01-14 DIAGNOSIS — Z8616 Personal history of COVID-19: Secondary | ICD-10-CM | POA: Diagnosis not present

## 2020-02-21 NOTE — Progress Notes (Signed)
Chief Complaint  Patient presents with  . Gynecologic Exam     HPI:      [name] is a 54 y.o. [id number] who LMP was Patient's last menstrual period was 02/13/2020 (exact date)., presents today for her annual examination.  Her menses are Q4 wks, lasting 3 days. Dysmenorrhea minimal. She does not have intermenstrual bleeding. No vasomotor sx.   Sex activity: single partner, contraception - tubal ligation.  Last Pap: 02/05/17  Results were: no abnormalities /neg HPV DNA . 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 Hx of STDs: HSV, HPV. She takes acyclovir prn outbreaks which are rare. She needs Rx RF.  Last mammogram: 05/25/19  Results were: normal--routine follow-up in 12 months There is no FH of breast cancer. There is no FH of ovarian cancer. The patient does do self-breast exams. She has a strong FH of colon cancer in 3rd degree relatives on mat and pat side. She gets colonoscopies Q64yr with Dr. EVira Agarand is due 03/2020. MyRisk neg except APC VUS 2019. Riskscore=9.6%.  Tobacco use: The patient denies current or previous tobacco use. Alcohol use: none Drug use: none Exercise: moderately active  She does get adequate calcium and Vitamin D in her diet.  She has labs with PCP.   Past Medical History:  Diagnosis Date  . Asthma   . Blighted ovum    XX22 . BRCA negative 01/2018   MyRisk neg except APC VUS; IBIS=9.7%, riskscore=9.6%  . Cervical dysplasia    CIN II  . Family history of colon cancer 2013   STRONG FH ON BOTH SIDES. COLARIS TESTING REC. PT NEVER CAME FOR LAB DRAW  . GERD (gastroesophageal reflux disease)   . Herpes, genital   . History of mammogram 12/16/14; 02/22/16   BIRAD 1; NEG  . History of Papanicolaou smear of cervix 12/23/2014   -/-  . Migraine   . Mitral valve prolapse   . Pap smear abnormality of cervix with HGSIL 2000    Past Surgical History:  Procedure Laterality Date  . CERVICAL BIOPSY  W/ LOOP ELECTRODE EXCISION  2001   mod dysp  .  CERVICAL CERCLAGE     x2  . CESAREAN SECTION  2000  . COLONOSCOPY  2011   wnl; repeat due 2015 c Dr. EVira Agar . COLPOSCOPY  2000  . DILATION AND CURETTAGE OF UTERUS     x2  . REFRACTIVE SURGERY  07/2016   Right eye  . SPINAL FUSION  1990  . TUBAL LIGATION      Family History  Problem Relation Age of Onset  . Colon cancer Other 555      3 mat relatives  . Colon cancer Other 70       COLON  . Breast cancer Neg Hx     Social History   Socioeconomic History  . Marital status: Married    Spouse name: Not on file  . Number of children: 2  . Years of education: 151 . Highest education level: Not on file  Occupational History  . Not on file  Tobacco Use  . Smoking status: Never Smoker  . Smokeless tobacco: Never Used  Vaping Use  . Vaping Use: Never used  Substance and Sexual Activity  . Alcohol use: No  . Drug use: No  . Sexual activity: Yes    Birth control/protection: Surgical    Comment: TUBAL LIGATION  Other Topics Concern  . Not on file  Social History Narrative  . Not on file   Social Determinants of Health   Financial Resource Strain:   . Difficulty of Paying Living Expenses:   Food Insecurity:   . Worried About Charity fundraiser in the Last Year:   . Arboriculturist in the Last Year:   Transportation Needs:   . Film/video editor (Medical):   Marland Kitchen Lack of Transportation (Non-Medical):   Physical Activity:   . Days of Exercise per Week:   . Minutes of Exercise per Session:   Stress:   . Feeling of Stress :   Social Connections:   . Frequency of Communication with Friends and Family:   . Frequency of Social Gatherings with Friends and Family:   . Attends Religious Services:   . Active Member of Clubs or Organizations:   . Attends Archivist Meetings:   Marland Kitchen Marital Status:   Intimate Partner Violence:   . Fear of Current or Ex-Partner:   . Emotionally Abused:   Marland Kitchen Physically Abused:   . Sexually Abused:     Current Outpatient  Medications on File Prior to Visit  Medication Sig Dispense Refill  . aspirin-acetaminophen-caffeine (EXCEDRIN MIGRAINE) 250-250-65 MG tablet     . bimatoprost (LATISSE) 0.03 % ophthalmic solution   6  . Calcium-Vitamin D-Vitamin K 528-413-24 MG-UNT-MCG CHEW Chew by mouth.    . diclofenac Sodium (VOLTAREN) 1 % GEL Apply topically.    Marland Kitchen ibuprofen (ADVIL,MOTRIN) 200 MG tablet Take 200 mg by mouth every 6 (six) hours as needed.    . Multiple Vitamin (MULTIVITAMIN) capsule Take 1 capsule by mouth daily.    . naproxen sodium (ANAPROX) 220 MG tablet Take 1 tablet by mouth as needed.    Marland Kitchen omeprazole (PRILOSEC) 20 MG capsule Take 1 capsule by mouth daily.     No current facility-administered medications on file prior to visit.    ROS:  Review of Systems  Constitutional: Negative for fatigue, fever and unexpected weight change.  Respiratory: Negative for cough, shortness of breath and wheezing.   Cardiovascular: Negative for chest pain, palpitations and leg swelling.  Gastrointestinal: Negative for blood in stool, constipation, diarrhea, nausea and vomiting.  Endocrine: Negative for cold intolerance, heat intolerance and polyuria.  Genitourinary: Negative for dyspareunia, dysuria, flank pain, frequency, genital sores, hematuria, menstrual problem, pelvic pain, urgency, vaginal bleeding, vaginal discharge and vaginal pain.  Musculoskeletal: Positive for arthralgias. Negative for back pain, joint swelling and myalgias.  Skin: Negative for rash.  Neurological: Positive for headaches. Negative for dizziness, syncope, light-headedness and numbness.  Hematological: Negative for adenopathy.  Psychiatric/Behavioral: Negative for agitation, confusion, sleep disturbance and suicidal ideas. The patient is not nervous/anxious.      Objective: BP (!) 100/60   Ht 5' (1.524 m)   Wt 118 lb (53.5 kg)   LMP 02/13/2020 (Exact Date)   BMI 23.05 kg/m    Physical Exam Constitutional:      Appearance: She  is well-developed.  Genitourinary:     Vulva, vagina, uterus, right adnexa and left adnexa normal.     No vulval lesion or tenderness noted.     No vaginal discharge, erythema or tenderness.     No cervical motion tenderness or polyp.     Uterus is not enlarged or tender.     No right or left adnexal mass present.     Right adnexa not tender.     Left adnexa not tender.  Neck:     Thyroid:  No thyromegaly.  Cardiovascular:     Rate and Rhythm: Normal rate and regular rhythm.     Heart sounds: Normal heart sounds. No murmur heard.   Pulmonary:     Effort: Pulmonary effort is normal.     Breath sounds: Normal breath sounds.  Chest:     Breasts:        Right: No mass, nipple discharge, skin change or tenderness.        Left: No mass, nipple discharge, skin change or tenderness.  Abdominal:     Palpations: Abdomen is soft.     Tenderness: There is no abdominal tenderness. There is no guarding.  Musculoskeletal:        General: Normal range of motion.     Cervical back: Normal range of motion.  Neurological:     General: No focal deficit present.     Mental Status: She is alert and oriented to person, place, and time.     Cranial Nerves: No cranial nerve deficit.  Skin:    General: Skin is warm and dry.  Psychiatric:        Mood and Affect: Mood normal.        Behavior: Behavior normal.        Thought Content: Thought content normal.        Judgment: Judgment normal.  Vitals reviewed.     Assessment/Plan:  Encounter for annual routine gynecological examination  Cervical cancer screening - Plan: Cytology - PAP  Screening for HPV (human papillomavirus) - Plan: Cytology - PAP  Screening for breast cancer - Plan: MM 3D SCREEN BREAST BILATERAL, Pt to sched mammo  Family history of colon cancer - Plan: Neg MyRisk testing. Getting Q5 yr colonoscopies; sched 9/21.  Herpes simplex vulvovaginitis - Plan: acyclovir (ZOVIRAX) 400 MG tablet, Rx RF acyclovir. F/u prn.   Meds  ordered this encounter  Medications  . acyclovir (ZOVIRAX) 400 MG tablet    Sig: TAKE 1 TABLET BY MOUTH 3 TIMES DAILY FOR 5 DAYS AS NEEDED FOR SYMPTOMS    Dispense:  30 tablet    Refill:  1    Order Specific Question:   Supervising Provider    Answer:   Gae Dry [[id number]]             GYN counsel breast self exam, mammography screening, adequate intake of calcium and vitamin D, diet and exercise     F/U  Return in about 1 year (around 02/21/2021).  Yun Gutierrez B. Kiyon Fidalgo, PA-C 02/22/2020 9:23 AM

## 2020-02-22 ENCOUNTER — Other Ambulatory Visit (HOSPITAL_COMMUNITY)
Admission: RE | Admit: 2020-02-22 | Discharge: 2020-02-22 | Disposition: A | Discharge status: Home / Self Care | Payer: 59 | Source: Ambulatory Visit | Attending: Obstetrics and Gynecology | Admitting: Obstetrics and Gynecology

## 2020-02-22 ENCOUNTER — Encounter: Payer: Self-pay | Admitting: Obstetrics and Gynecology

## 2020-02-22 ENCOUNTER — Other Ambulatory Visit: Payer: Self-pay

## 2020-02-22 ENCOUNTER — Ambulatory Visit (INDEPENDENT_AMBULATORY_CARE_PROVIDER_SITE_OTHER): Payer: 59 | Admitting: Obstetrics and Gynecology

## 2020-02-22 VITALS — BP 100/60 | Ht 60.0 in | Wt 118.0 lb

## 2020-02-22 DIAGNOSIS — Z1151 Encounter for screening for human papillomavirus (HPV): Secondary | ICD-10-CM | POA: Diagnosis not present

## 2020-02-22 DIAGNOSIS — Z01419 Encounter for gynecological examination (general) (routine) without abnormal findings: Secondary | ICD-10-CM | POA: Diagnosis not present

## 2020-02-22 DIAGNOSIS — Z1231 Encounter for screening mammogram for malignant neoplasm of breast: Secondary | ICD-10-CM | POA: Diagnosis not present

## 2020-02-22 DIAGNOSIS — Z124 Encounter for screening for malignant neoplasm of cervix: Secondary | ICD-10-CM | POA: Diagnosis not present

## 2020-02-22 DIAGNOSIS — A6004 Herpesviral vulvovaginitis: Secondary | ICD-10-CM | POA: Diagnosis not present

## 2020-02-22 MED ORDER — ACYCLOVIR 400 MG PO TABS
ORAL_TABLET | ORAL | 1 refills | Status: DC
  Filled 2020-11-07: qty 30, 10d supply, fill #0

## 2020-02-22 NOTE — Patient Instructions (Signed)
I value your feedback and entrusting us with your care. If you get a [facility] patient survey, I would appreciate you taking the time to let us know about your experience today. Thank you! ° °As of July 02, 2019, your lab results will be released to your MyChart immediately, before I even have a chance to see them. Please give me time to review them and contact you if there are any abnormalities. Thank you for your patience.  ° °[facility]: [phone] ° ° ° °

## 2020-02-23 LAB — CYTOLOGY - PAP
Adequacy: ABSENT
Comment: NEGATIVE
Diagnosis: NEGATIVE
High risk HPV: NEGATIVE

## 2020-04-05 DIAGNOSIS — Z01818 Encounter for other preprocedural examination: Secondary | ICD-10-CM | POA: Diagnosis not present

## 2020-04-08 DIAGNOSIS — K640 First degree hemorrhoids: Secondary | ICD-10-CM | POA: Diagnosis not present

## 2020-04-08 DIAGNOSIS — Z8371 Family history of colonic polyps: Secondary | ICD-10-CM | POA: Diagnosis not present

## 2020-04-08 DIAGNOSIS — Z1211 Encounter for screening for malignant neoplasm of colon: Secondary | ICD-10-CM | POA: Diagnosis not present

## 2020-04-18 ENCOUNTER — Other Ambulatory Visit: Payer: Self-pay | Admitting: Internal Medicine

## 2020-04-18 DIAGNOSIS — G43109 Migraine with aura, not intractable, without status migrainosus: Secondary | ICD-10-CM | POA: Diagnosis not present

## 2020-04-18 DIAGNOSIS — Z1159 Encounter for screening for other viral diseases: Secondary | ICD-10-CM | POA: Diagnosis not present

## 2020-04-18 DIAGNOSIS — Z0000 Encounter for general adult medical examination without abnormal findings: Secondary | ICD-10-CM | POA: Diagnosis not present

## 2020-04-18 DIAGNOSIS — Z79899 Other long term (current) drug therapy: Secondary | ICD-10-CM | POA: Diagnosis not present

## 2020-05-06 DIAGNOSIS — M25542 Pain in joints of left hand: Secondary | ICD-10-CM | POA: Diagnosis not present

## 2020-05-06 DIAGNOSIS — M1811 Unilateral primary osteoarthritis of first carpometacarpal joint, right hand: Secondary | ICD-10-CM | POA: Diagnosis not present

## 2020-05-06 DIAGNOSIS — M25541 Pain in joints of right hand: Secondary | ICD-10-CM | POA: Diagnosis not present

## 2020-05-06 DIAGNOSIS — M1812 Unilateral primary osteoarthritis of first carpometacarpal joint, left hand: Secondary | ICD-10-CM | POA: Diagnosis not present

## 2020-05-27 ENCOUNTER — Other Ambulatory Visit: Payer: Self-pay

## 2020-05-27 ENCOUNTER — Ambulatory Visit
Admission: RE | Admit: 2020-05-27 | Discharge: 2020-05-27 | Disposition: A | Discharge status: Home / Self Care | Payer: 59 | Source: Ambulatory Visit | Attending: Obstetrics and Gynecology | Admitting: Obstetrics and Gynecology

## 2020-05-27 DIAGNOSIS — Z1231 Encounter for screening mammogram for malignant neoplasm of breast: Secondary | ICD-10-CM

## 2020-05-27 DIAGNOSIS — N289 Disorder of kidney and ureter, unspecified: Secondary | ICD-10-CM | POA: Diagnosis not present

## 2020-07-06 ENCOUNTER — Other Ambulatory Visit: Payer: Self-pay | Admitting: Orthopedic Surgery

## 2020-07-26 IMAGING — MG MM DIGITAL SCREENING BILAT W/ TOMO W/ CAD
8 series · 9 of 24 positions shown · non-contrast
Comparison: Previous exam(s).

CLINICAL DATA: Screening.

EXAM:
DIGITAL SCREENING BILATERAL MAMMOGRAM WITH TOMO AND CAD

[L CC synth-2D]
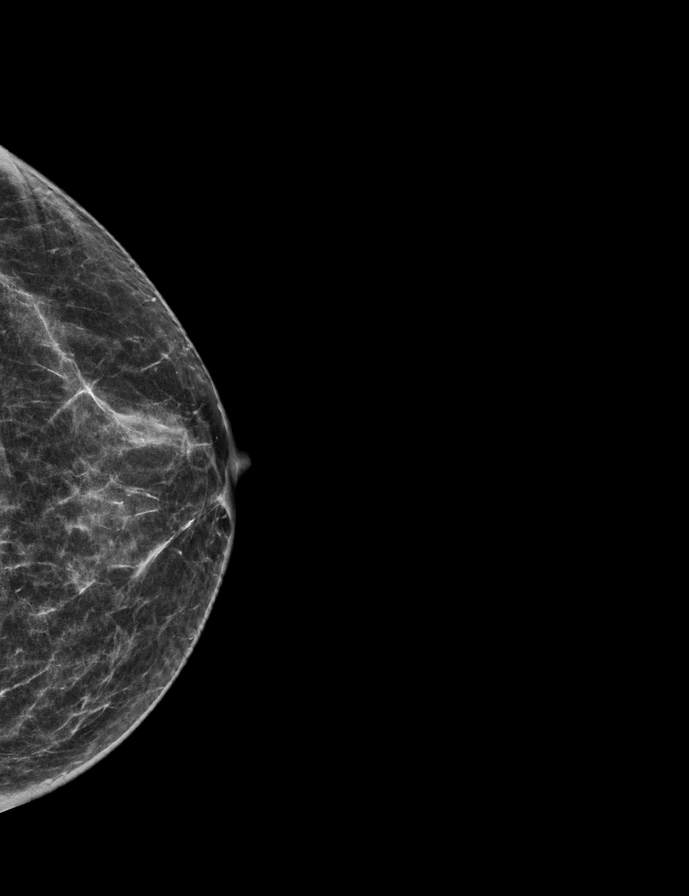

[R MLO synth-2D]
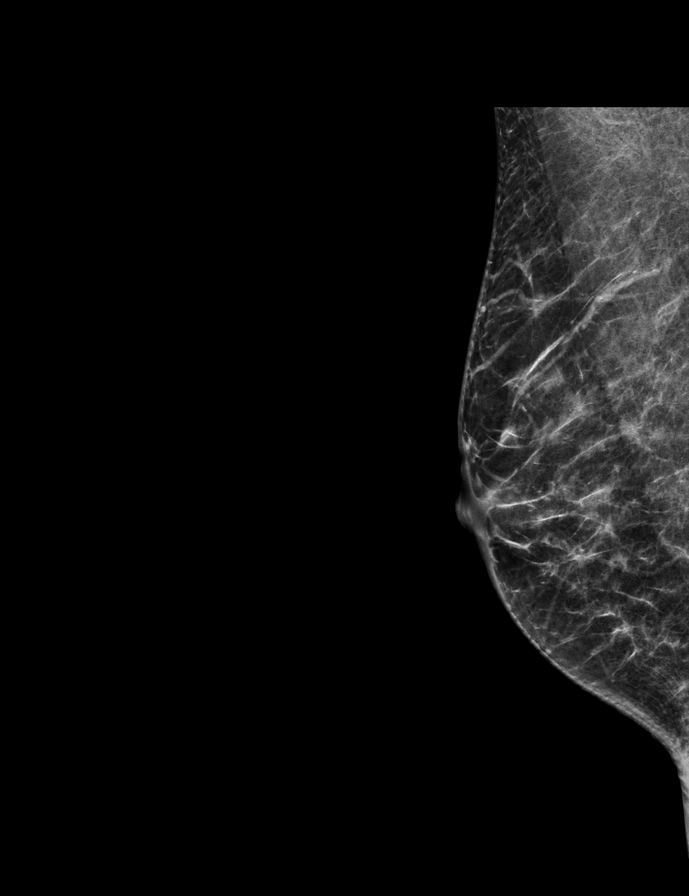

[R CC synth-2D]
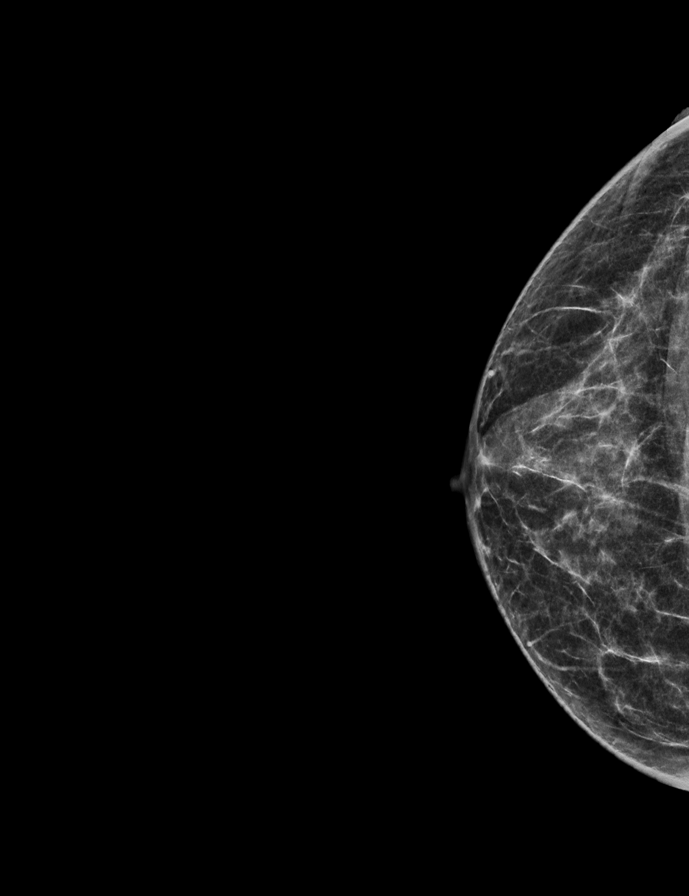

[L MLO synth-2D]
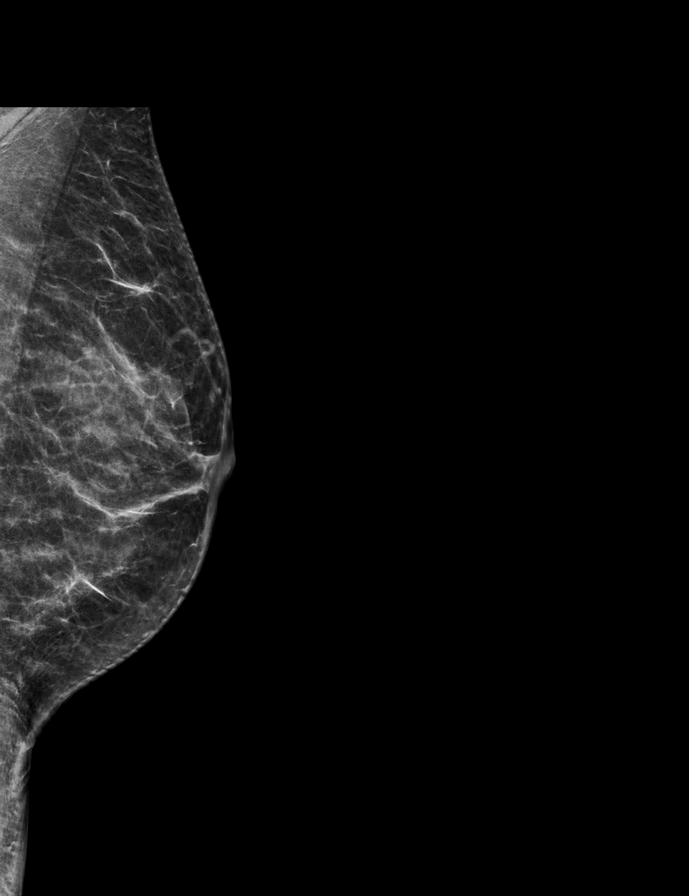

[R MLO tomo · 2 of 50 frames shown]
[frame 17/50]
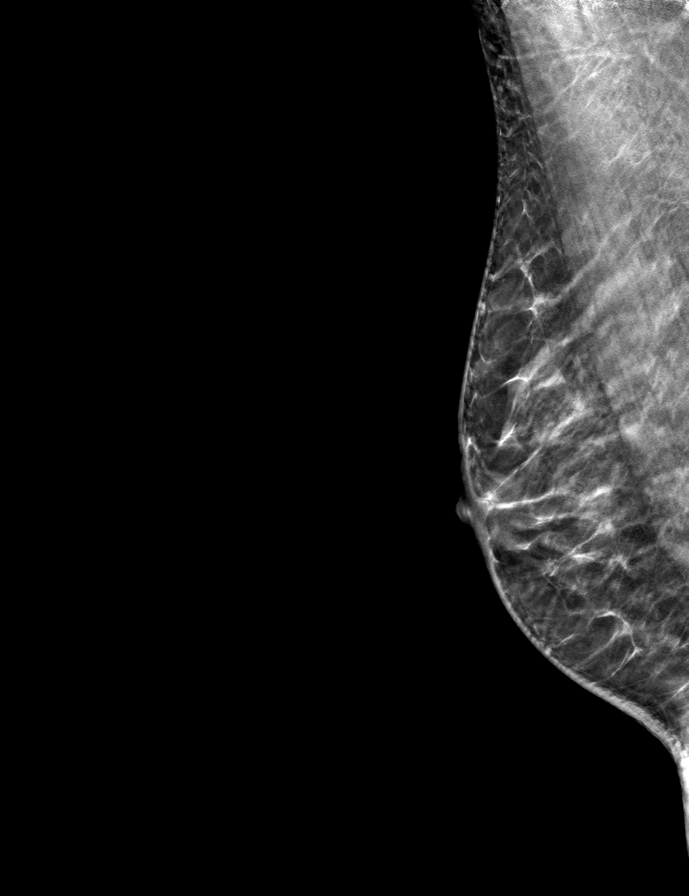
[frame 25/50]
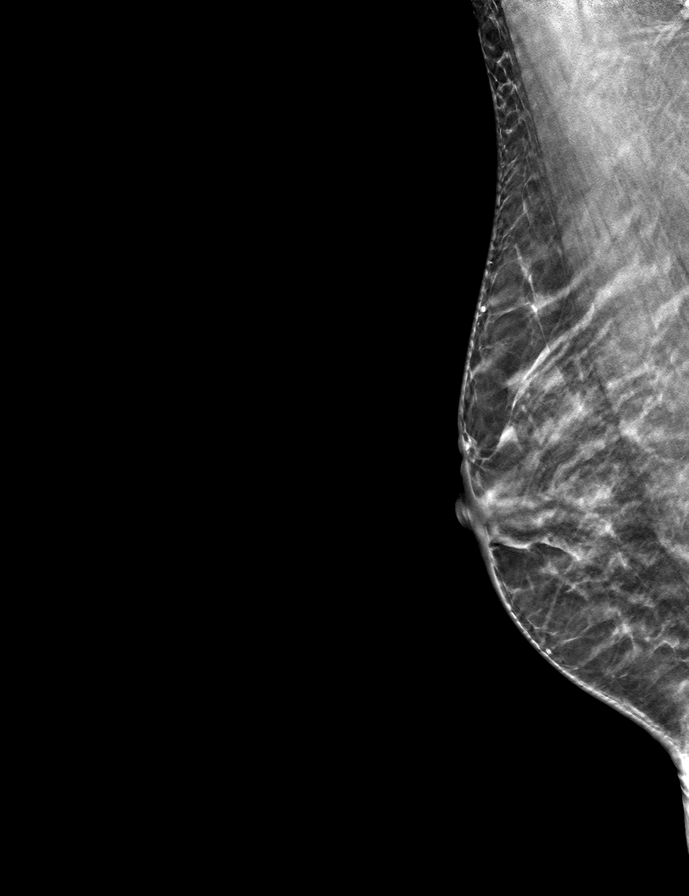

[L CC tomo · tomo slice 25/49.0]
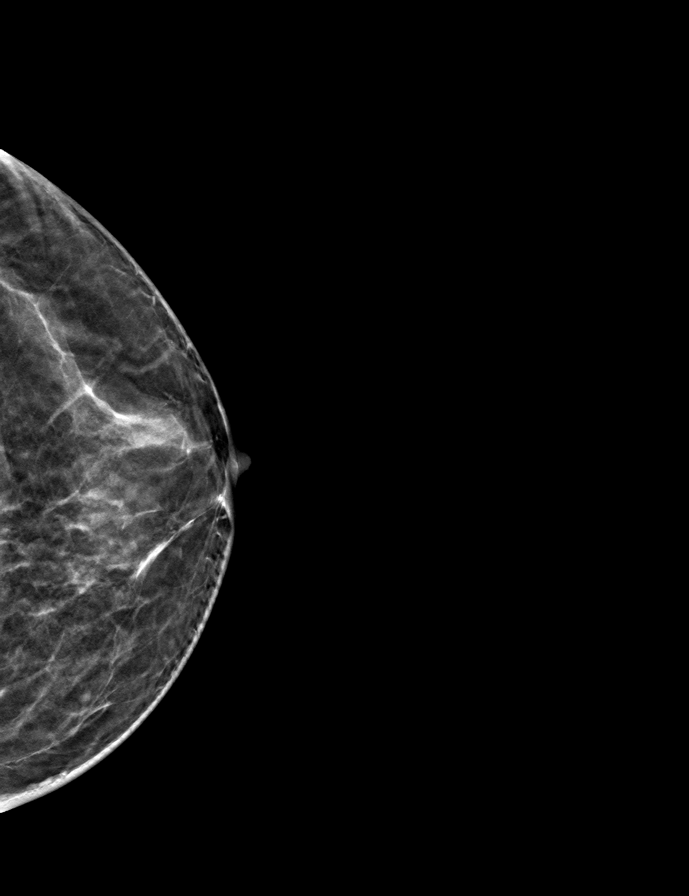

[L MLO tomo · tomo slice 26/51.0]
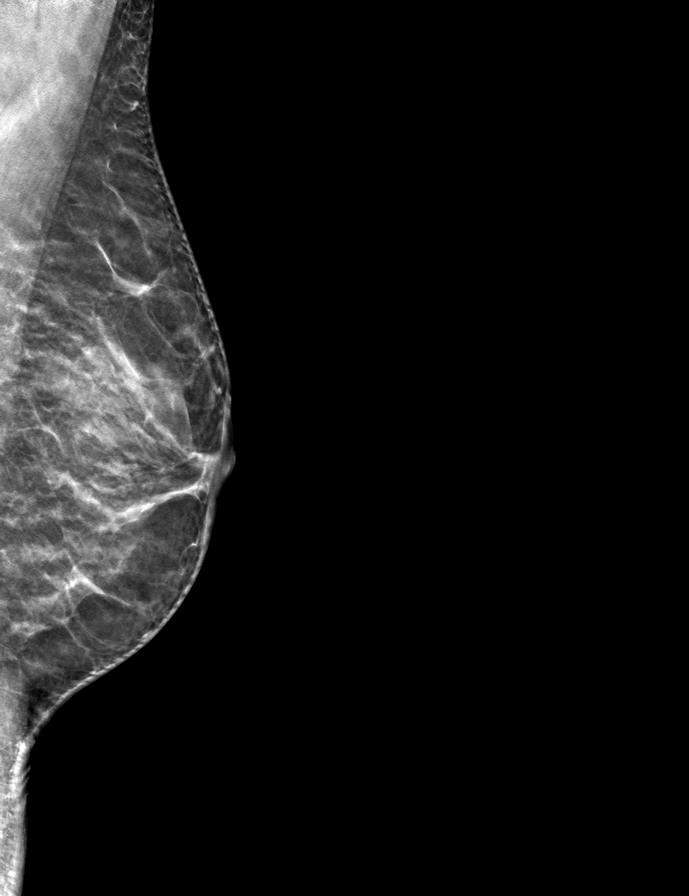

[R CC tomo · tomo slice 25/50.0]
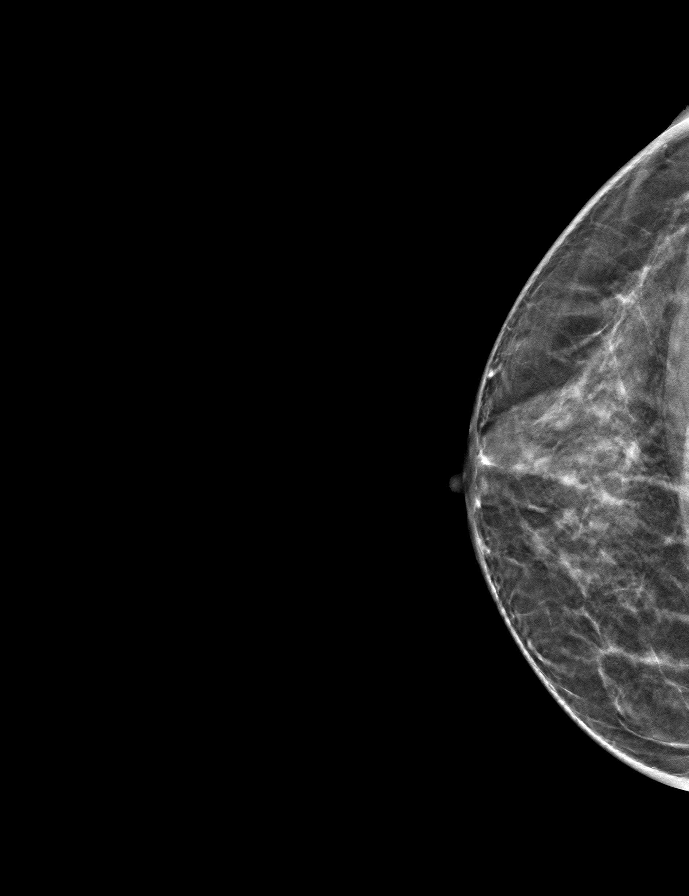

[9 of 24 positions shown; findings below may reference images not displayed]

ACR Breast Density Category c: The breast tissue is heterogeneously
dense, which may obscure small masses.
FINDINGS: There are no findings suspicious for malignancy. Images were
processed with CAD.
IMPRESSION: No mammographic evidence of malignancy. A result letter of this
screening mammogram will be mailed directly to the patient.

RECOMMENDATION:
Screening mammogram in one year. (Code:FT-U-LHB)

BI-RADS CATEGORY  1: Negative.

## 2020-08-04 ENCOUNTER — Other Ambulatory Visit: Payer: Self-pay | Admitting: Internal Medicine

## 2020-10-22 ENCOUNTER — Other Ambulatory Visit: Payer: Self-pay

## 2020-10-22 MED FILL — Omeprazole Cap Delayed Release 20 MG: ORAL | 90 days supply | Qty: 90 | Fill #0 | Status: AC

## 2020-11-07 ENCOUNTER — Other Ambulatory Visit: Payer: Self-pay | Admitting: Obstetrics and Gynecology

## 2020-11-07 ENCOUNTER — Other Ambulatory Visit: Payer: Self-pay

## 2020-11-07 DIAGNOSIS — A6004 Herpesviral vulvovaginitis: Secondary | ICD-10-CM

## 2020-11-07 MED FILL — Meloxicam Tab 7.5 MG: ORAL | 90 days supply | Qty: 90 | Fill #0 | Status: AC

## 2020-11-08 ENCOUNTER — Other Ambulatory Visit: Payer: Self-pay

## 2020-11-08 MED ORDER — BIMATOPROST 0.03 % EX SOLN
CUTANEOUS | 4 refills | Status: DC
  Filled 2020-11-08: qty 5, 30d supply, fill #0
  Filled 2021-02-21: qty 3, 30d supply, fill #1
  Filled 2021-10-26: qty 3, 30d supply, fill #2

## 2020-11-09 ENCOUNTER — Other Ambulatory Visit: Payer: Self-pay

## 2021-01-13 DIAGNOSIS — H524 Presbyopia: Secondary | ICD-10-CM | POA: Diagnosis not present

## 2021-02-17 ENCOUNTER — Other Ambulatory Visit: Payer: Self-pay

## 2021-02-17 MED FILL — Meloxicam Tab 7.5 MG: ORAL | 90 days supply | Qty: 90 | Fill #1 | Status: AC

## 2021-02-21 ENCOUNTER — Other Ambulatory Visit: Payer: Self-pay

## 2021-02-21 MED FILL — Omeprazole Cap Delayed Release 20 MG: ORAL | 90 days supply | Qty: 90 | Fill #1 | Status: AC

## 2021-02-22 ENCOUNTER — Other Ambulatory Visit: Payer: Self-pay

## 2021-03-21 MED FILL — Omeprazole Cap Delayed Release 20 MG: ORAL | 90 days supply | Qty: 90 | Fill #2 | Status: CN

## 2021-03-22 ENCOUNTER — Other Ambulatory Visit: Payer: Self-pay

## 2021-03-30 NOTE — Progress Notes (Signed)
Chief Complaint  Patient presents with   Gynecologic Exam    No concerns     HPI:      [name] is a 55 y.o. [id number] who LMP was Patient's last menstrual period was 03/25/2021 (exact date)., presents today for her annual examination.  Her menses are Q4 wks, lasting 3 days. Dysmenorrhea minimal. She does not have intermenstrual bleeding. No vasomotor sx.    Sex activity: single partner, contraception - tubal ligation.  Last Pap: 02/22/20  Results were: no abnormalities /neg HPV DNA . 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 Pap due Q3 yrs for at least 25 yrs per [facility].  Hx of STDs: HSV, HPV. She takes acyclovir prn outbreaks which are rare. She needs Rx RF.   Last mammogram: 05/27/20  Results were: normal--routine follow-up in 12 months There is no FH of breast cancer. There is no FH of ovarian cancer. The patient does do self-breast exams. She has a strong FH of colon cancer in 3rd degree relatives on mat and pat side. She gets colonoscopies Q17yrs with Dr. Vira Agar, last one 03/2020.  MyRisk neg except APC VUS 2019. Riskscore=9.6%.  Tobacco use: The patient denies current or previous tobacco use. Alcohol use: none Drug use: none Exercise: moderately active   She does get adequate calcium and Vitamin D in her diet.   She has labs with PCP.    Past Medical History:  Diagnosis Date   Asthma    Blighted ovum    X2   BRCA negative 01/2018   MyRisk neg except APC VUS; IBIS=9.7%, riskscore=9.6%   Cervical dysplasia    CIN II   Family history of colon cancer 2013   STRONG FH ON BOTH SIDES. COLARIS TESTING REC. PT NEVER CAME FOR LAB DRAW   GERD (gastroesophageal reflux disease)    Herpes, genital    History of mammogram 12/16/14; 02/22/16   BIRAD 1; NEG   History of Papanicolaou smear of cervix 12/23/2014   -/-   Migraine    Mitral valve prolapse    Pap smear abnormality of cervix with HGSIL 2000    Past Surgical History:  Procedure Laterality Date   CERVICAL BIOPSY  W/  LOOP ELECTRODE EXCISION  2001   mod dysp   CERVICAL CERCLAGE     x2   CESAREAN SECTION  2000   COLONOSCOPY  2011   wnl; repeat due 2015 c Dr. Vira Agar   COLPOSCOPY  2000   DILATION AND CURETTAGE OF UTERUS     x2   REFRACTIVE SURGERY  07/2016   Right eye   SPINAL FUSION  1990   TUBAL LIGATION      Family History  Problem Relation Age of Onset   Colon cancer Other 13       3 mat relatives   Colon cancer Other 24       COLON   Breast cancer Neg Hx     Social History   Socioeconomic History   Marital status: Married    Spouse name: Not on file   Number of children: 2   Years of education: 14   Highest education level: Not on file  Occupational History   Not on file  Tobacco Use   Smoking status: Never   Smokeless tobacco: Never  Vaping Use   Vaping Use: Never used  Substance and Sexual Activity   Alcohol use: No   Drug use: No   Sexual activity: Yes  Birth control/protection: Surgical    Comment: TUBAL LIGATION  Other Topics Concern   Not on file  Social History Narrative   Not on file   Social Determinants of Health   Financial Resource Strain: Not on file  Food Insecurity: Not on file  Transportation Needs: Not on file  Physical Activity: Not on file  Stress: Not on file  Social Connections: Not on file  Intimate Partner Violence: Not on file    Current Outpatient Medications on File Prior to Visit  Medication Sig Dispense Refill   aspirin-acetaminophen-caffeine (EXCEDRIN MIGRAINE) 250-250-65 MG tablet      bimatoprost (LATISSE) 0.03 % ophthalmic solution Apply 1 drop via applicator twice a day 5 mL 4   Calcium-Vitamin D-Vitamin K 500-100-40 MG-UNT-MCG CHEW Chew by mouth.     meloxicam (MOBIC) 7.5 MG tablet TAKE 1 TABLET BY MOUTH ONCE DAILY AS NEEDED FOR PAIN 90 tablet 2   Multiple Vitamin (MULTIVITAMIN) capsule Take 1 capsule by mouth daily.     omeprazole (PRILOSEC) 20 MG capsule TAKE 1 CAPSULE BY MOUTH ONCE DAILY 90 capsule 3   No current  facility-administered medications on file prior to visit.    ROS:  Review of Systems  Constitutional:  Negative for fatigue, fever and unexpected weight change.  Respiratory:  Negative for cough, shortness of breath and wheezing.   Cardiovascular:  Negative for chest pain, palpitations and leg swelling.  Gastrointestinal:  Negative for blood in stool, constipation, diarrhea, nausea and vomiting.  Endocrine: Negative for cold intolerance, heat intolerance and polyuria.  Genitourinary:  Negative for dyspareunia, dysuria, flank pain, frequency, genital sores, hematuria, menstrual problem, pelvic pain, urgency, vaginal bleeding, vaginal discharge and vaginal pain.  Musculoskeletal:  Positive for arthralgias. Negative for back pain, joint swelling and myalgias.  Skin:  Negative for rash.  Neurological:  Negative for dizziness, syncope, light-headedness, numbness and headaches.  Hematological:  Negative for adenopathy.  Psychiatric/Behavioral:  Negative for agitation, confusion, sleep disturbance and suicidal ideas. The patient is not nervous/anxious.     Objective: BP 110/80   Ht 5' (1.524 m)   Wt 113 lb (51.3 kg)   LMP 03/25/2021 (Exact Date)   BMI 22.07 kg/m    Physical Exam Constitutional:      Appearance: She is well-developed.  Genitourinary:     Vulva normal.     Right Labia: No rash, tenderness or lesions.    Left Labia: No tenderness, lesions or rash.    No vaginal discharge, erythema or tenderness.      Right Adnexa: not tender and no mass present.    Left Adnexa: not tender and no mass present.    No cervical motion tenderness, friability or polyp.     Uterus is not enlarged or tender.  Breasts:    Right: No mass, nipple discharge, skin change or tenderness.     Left: No mass, nipple discharge, skin change or tenderness.  Neck:     Thyroid: No thyromegaly.  Cardiovascular:     Rate and Rhythm: Normal rate and regular rhythm.     Heart sounds: Normal heart sounds.  No murmur heard. Pulmonary:     Effort: Pulmonary effort is normal.     Breath sounds: Normal breath sounds.  Abdominal:     Palpations: Abdomen is soft.     Tenderness: There is no abdominal tenderness. There is no guarding or rebound.  Musculoskeletal:        General: Normal range of motion.     Cervical back:  Normal range of motion.  Lymphadenopathy:     Cervical: No cervical adenopathy.  Neurological:     General: No focal deficit present.     Mental Status: She is alert and oriented to person, place, and time.     Cranial Nerves: No cranial nerve deficit.  Skin:    General: Skin is warm and dry.  Psychiatric:        Mood and Affect: Mood normal.        Behavior: Behavior normal.        Thought Content: Thought content normal.        Judgment: Judgment normal.  Vitals reviewed.    Assessment/Plan:  Encounter for annual routine gynecological examination  Encounter for screening mammogram for malignant neoplasm of breast - Plan: MM 3D SCREEN BREAST BILATERAL; pt to sched mammo  Herpes simplex vulvovaginitis - Plan: acyclovir (ZOVIRAX) 400 MG tablet; Rx RF  Screening for colon cancer--Q5 yr colonoscopies at [facility], next on 9/26  Family history of colon cancer--MyRisk neg.    Meds ordered this encounter  Medications   acyclovir (ZOVIRAX) 400 MG tablet    Sig: TAKE 1 TABLET BY MOUTH 3 TIMES DAILY FOR 5 DAYS AS NEEDED FOR SYMPTOMS    Dispense:  30 tablet    Refill:  1    Order Specific Question:   Supervising Provider    Answer:   Gae Dry [[id number]]              GYN counsel breast self exam, mammography screening, adequate intake of calcium and vitamin D, diet and exercise     F/U  Return in about 1 year (around 03/31/2022).  Stacyann Mcconaughy B. Sharanya Templin, PA-C 03/31/2021 9:27 AM

## 2021-03-31 ENCOUNTER — Ambulatory Visit (INDEPENDENT_AMBULATORY_CARE_PROVIDER_SITE_OTHER): Payer: 59 | Admitting: Obstetrics and Gynecology

## 2021-03-31 ENCOUNTER — Other Ambulatory Visit: Payer: Self-pay

## 2021-03-31 ENCOUNTER — Encounter: Payer: Self-pay | Admitting: Obstetrics and Gynecology

## 2021-03-31 VITALS — BP 110/80 | Ht 60.0 in | Wt 113.0 lb

## 2021-03-31 DIAGNOSIS — A6004 Herpesviral vulvovaginitis: Secondary | ICD-10-CM

## 2021-03-31 DIAGNOSIS — Z1211 Encounter for screening for malignant neoplasm of colon: Secondary | ICD-10-CM

## 2021-03-31 DIAGNOSIS — Z800 Family history of malignant neoplasm of digestive organs: Secondary | ICD-10-CM | POA: Diagnosis not present

## 2021-03-31 DIAGNOSIS — Z1231 Encounter for screening mammogram for malignant neoplasm of breast: Secondary | ICD-10-CM

## 2021-03-31 DIAGNOSIS — Z01419 Encounter for gynecological examination (general) (routine) without abnormal findings: Secondary | ICD-10-CM

## 2021-03-31 MED ORDER — ACYCLOVIR 400 MG PO TABS
ORAL_TABLET | ORAL | 1 refills | Status: DC
  Filled 2021-03-31 – 2021-09-05 (×2): qty 30, 10d supply, fill #0
  Filled 2021-10-26: qty 30, 10d supply, fill #1

## 2021-03-31 NOTE — Patient Instructions (Addendum)
I value your feedback and you entrusting us with your care. If you get a [facility] patient survey, I would appreciate you taking the time to let us know about your experience today. Thank you!  [facility]: [phone]      

## 2021-04-13 ENCOUNTER — Other Ambulatory Visit: Payer: Self-pay

## 2021-06-02 ENCOUNTER — Other Ambulatory Visit: Payer: Self-pay

## 2021-06-02 ENCOUNTER — Ambulatory Visit
Admission: RE | Admit: 2021-06-02 | Discharge: 2021-06-02 | Disposition: A | Discharge status: Home / Self Care | Payer: 59 | Source: Ambulatory Visit | Attending: Obstetrics and Gynecology | Admitting: Obstetrics and Gynecology

## 2021-06-02 DIAGNOSIS — Z1231 Encounter for screening mammogram for malignant neoplasm of breast: Secondary | ICD-10-CM | POA: Diagnosis not present

## 2021-06-23 DIAGNOSIS — Z0000 Encounter for general adult medical examination without abnormal findings: Secondary | ICD-10-CM | POA: Diagnosis not present

## 2021-06-23 DIAGNOSIS — E538 Deficiency of other specified B group vitamins: Secondary | ICD-10-CM | POA: Diagnosis not present

## 2021-06-23 DIAGNOSIS — G43109 Migraine with aura, not intractable, without status migrainosus: Secondary | ICD-10-CM | POA: Diagnosis not present

## 2021-07-29 ENCOUNTER — Other Ambulatory Visit: Payer: Self-pay

## 2021-07-31 ENCOUNTER — Other Ambulatory Visit: Payer: Self-pay

## 2021-07-31 MED ORDER — OMEPRAZOLE 20 MG PO CPDR
DELAYED_RELEASE_CAPSULE | ORAL | 3 refills | Status: DC
  Filled 2021-07-31: qty 90, 90d supply, fill #0
  Filled 2022-01-20: qty 90, 90d supply, fill #1
  Filled 2022-06-10: qty 90, 90d supply, fill #2

## 2021-07-31 MED ORDER — MELOXICAM 7.5 MG PO TABS
ORAL_TABLET | ORAL | 2 refills | Status: DC
  Filled 2021-07-31: qty 90, 90d supply, fill #0
  Filled 2022-01-20: qty 90, 90d supply, fill #1
  Filled 2022-06-10: qty 90, 90d supply, fill #2

## 2021-08-02 ENCOUNTER — Other Ambulatory Visit: Payer: Self-pay

## 2021-09-05 ENCOUNTER — Other Ambulatory Visit: Payer: Self-pay

## 2021-09-15 DIAGNOSIS — H53149 Visual discomfort, unspecified: Secondary | ICD-10-CM | POA: Diagnosis not present

## 2021-09-15 DIAGNOSIS — R110 Nausea: Secondary | ICD-10-CM | POA: Diagnosis not present

## 2021-09-15 DIAGNOSIS — G43009 Migraine without aura, not intractable, without status migrainosus: Secondary | ICD-10-CM | POA: Diagnosis not present

## 2021-09-15 DIAGNOSIS — R519 Headache, unspecified: Secondary | ICD-10-CM | POA: Diagnosis not present

## 2021-09-20 ENCOUNTER — Other Ambulatory Visit: Payer: Self-pay

## 2021-09-20 MED ORDER — EMGALITY 120 MG/ML ~~LOC~~ SOAJ
SUBCUTANEOUS | 6 refills | Status: DC
  Filled 2021-09-20 – 2021-09-21 (×2): qty 1, 30d supply, fill #0
  Filled 2021-10-16 – 2021-10-23 (×4): qty 1, 30d supply, fill #1
  Filled 2021-10-31: qty 1, 30d supply, fill #2
  Filled 2021-11-25: qty 1, 30d supply, fill #3
  Filled 2021-12-24: qty 1, 30d supply, fill #4
  Filled 2022-01-24: qty 1, 30d supply, fill #5
  Filled 2022-02-24: qty 1, 30d supply, fill #6

## 2021-09-21 ENCOUNTER — Other Ambulatory Visit: Payer: Self-pay

## 2021-09-22 ENCOUNTER — Other Ambulatory Visit: Payer: Self-pay

## 2021-09-26 ENCOUNTER — Other Ambulatory Visit: Payer: Self-pay

## 2021-10-16 ENCOUNTER — Other Ambulatory Visit: Payer: Self-pay

## 2021-10-20 ENCOUNTER — Other Ambulatory Visit: Payer: Self-pay

## 2021-10-23 ENCOUNTER — Other Ambulatory Visit: Payer: Self-pay

## 2021-10-24 ENCOUNTER — Other Ambulatory Visit: Payer: Self-pay

## 2021-10-25 ENCOUNTER — Other Ambulatory Visit: Payer: Self-pay

## 2021-10-26 ENCOUNTER — Other Ambulatory Visit: Payer: Self-pay

## 2021-10-27 ENCOUNTER — Other Ambulatory Visit: Payer: Self-pay

## 2021-10-31 ENCOUNTER — Other Ambulatory Visit: Payer: Self-pay

## 2021-11-01 ENCOUNTER — Other Ambulatory Visit: Payer: Self-pay

## 2021-11-03 ENCOUNTER — Other Ambulatory Visit: Payer: Self-pay

## 2021-11-03 DIAGNOSIS — R519 Headache, unspecified: Secondary | ICD-10-CM | POA: Diagnosis not present

## 2021-11-03 DIAGNOSIS — R110 Nausea: Secondary | ICD-10-CM | POA: Diagnosis not present

## 2021-11-03 DIAGNOSIS — I781 Nevus, non-neoplastic: Secondary | ICD-10-CM | POA: Diagnosis not present

## 2021-11-03 DIAGNOSIS — H53149 Visual discomfort, unspecified: Secondary | ICD-10-CM | POA: Diagnosis not present

## 2021-11-03 DIAGNOSIS — I872 Venous insufficiency (chronic) (peripheral): Secondary | ICD-10-CM | POA: Diagnosis not present

## 2021-11-03 DIAGNOSIS — G43009 Migraine without aura, not intractable, without status migrainosus: Secondary | ICD-10-CM | POA: Diagnosis not present

## 2021-11-03 MED ORDER — EMGALITY 120 MG/ML ~~LOC~~ SOAJ
SUBCUTANEOUS | 11 refills | Status: DC
  Filled 2021-11-03 – 2022-03-27 (×3): qty 1, 28d supply, fill #0

## 2021-11-16 ENCOUNTER — Other Ambulatory Visit: Payer: Self-pay

## 2021-11-27 ENCOUNTER — Other Ambulatory Visit: Payer: Self-pay

## 2021-12-24 ENCOUNTER — Other Ambulatory Visit: Payer: Self-pay

## 2021-12-25 ENCOUNTER — Other Ambulatory Visit: Payer: Self-pay

## 2022-01-19 DIAGNOSIS — H5213 Myopia, bilateral: Secondary | ICD-10-CM | POA: Diagnosis not present

## 2022-01-22 ENCOUNTER — Other Ambulatory Visit: Payer: Self-pay

## 2022-01-23 ENCOUNTER — Other Ambulatory Visit: Payer: Self-pay

## 2022-01-24 ENCOUNTER — Other Ambulatory Visit: Payer: Self-pay

## 2022-01-30 ENCOUNTER — Other Ambulatory Visit: Payer: Self-pay

## 2022-01-30 MED ORDER — BIMATOPROST 0.03 % EX SOLN
CUTANEOUS | 3 refills | Status: AC
  Filled 2022-01-30: qty 3, 60d supply, fill #0
  Filled 2022-01-30: qty 3, 45d supply, fill #0
  Filled 2022-01-30: qty 3, 60d supply, fill #0
  Filled 2022-01-31: qty 3, 30d supply, fill #0
  Filled 2022-08-16: qty 3, 30d supply, fill #1

## 2022-01-31 ENCOUNTER — Other Ambulatory Visit: Payer: Self-pay

## 2022-02-26 ENCOUNTER — Other Ambulatory Visit: Payer: Self-pay

## 2022-03-06 ENCOUNTER — Other Ambulatory Visit: Payer: Self-pay | Admitting: Obstetrics and Gynecology

## 2022-03-06 DIAGNOSIS — A6004 Herpesviral vulvovaginitis: Secondary | ICD-10-CM

## 2022-03-07 ENCOUNTER — Other Ambulatory Visit: Payer: Self-pay

## 2022-03-07 MED ORDER — ACYCLOVIR 400 MG PO TABS
ORAL_TABLET | ORAL | 0 refills | Status: DC
  Filled 2022-03-07: qty 30, 10d supply, fill #0

## 2022-03-24 ENCOUNTER — Other Ambulatory Visit: Payer: Self-pay

## 2022-03-27 ENCOUNTER — Other Ambulatory Visit: Payer: Self-pay

## 2022-03-27 MED ORDER — EMGALITY 120 MG/ML ~~LOC~~ SOAJ
SUBCUTANEOUS | 6 refills | Status: DC
  Filled 2022-03-27: qty 1, 30d supply, fill #0
  Filled 2022-04-23 – 2022-05-02 (×3): qty 1, 30d supply, fill #1

## 2022-03-28 ENCOUNTER — Other Ambulatory Visit: Payer: Self-pay

## 2022-04-16 NOTE — Progress Notes (Unsigned)
  No chief complaint on file.    HPI:      [name] is a 56 y.o. G3P1112 who LMP was No LMP recorded., presents today for her annual examination.  Her menses are Q4 wks, lasting 3 days. Dysmenorrhea minimal. She does not have intermenstrual bleeding. No vasomotor sx.    Sex activity: single partner, contraception - tubal ligation.  Last Pap: 02/22/20  Results were: no abnormalities /neg HPV DNA . 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 Pap due Q3 yrs for at least 25 yrs per [facility].  Hx of STDs: HSV, HPV. She takes acyclovir prn outbreaks which are rare. She needs Rx RF.   Last mammogram: 06/02/21 Results were: normal--routine follow-up in 12 months There is no FH of breast cancer. There is no FH of ovarian cancer. The patient does do self-breast exams. She has a strong FH of colon cancer in 3rd degree relatives on mat and pat side. She gets colonoscopies Q5yrs with Dr. Elliott, last one 03/2020.  MyRisk neg except APC VUS 2019. Riskscore=9.6%.  Tobacco use: The patient denies current or previous tobacco use. Alcohol use: none Drug use: none Exercise: moderately active   She does get adequate calcium and Vitamin D in her diet.   She has labs with PCP.    Past Medical History:  Diagnosis Date   Asthma    Blighted ovum    X2   BRCA negative 01/2018   MyRisk neg except APC VUS; IBIS=9.7%, riskscore=9.6%   Cervical dysplasia    CIN II   Family history of colon cancer 2013   STRONG FH ON BOTH SIDES. COLARIS TESTING REC. PT NEVER CAME FOR LAB DRAW   GERD (gastroesophageal reflux disease)    Herpes, genital    History of mammogram 12/16/14; 02/22/16   BIRAD 1; NEG   History of Papanicolaou smear of cervix 12/23/2014   -/-   Migraine    Mitral valve prolapse    Pap smear abnormality of cervix with HGSIL 2000    Past Surgical History:  Procedure Laterality Date   CERVICAL BIOPSY  W/ LOOP ELECTRODE EXCISION  2001   mod dysp   CERVICAL CERCLAGE     x2   CESAREAN SECTION   2000   COLONOSCOPY  2011   wnl; repeat due 2015 c Dr. Elliott   COLPOSCOPY  2000   DILATION AND CURETTAGE OF UTERUS     x2   REFRACTIVE SURGERY  07/2016   Right eye   SPINAL FUSION  1990   TUBAL LIGATION      Family History  Problem Relation Age of Onset   Colon cancer Other 51       3 mat relatives   Colon cancer Other 70       COLON   Breast cancer Neg Hx     Social History   Socioeconomic History   Marital status: Married    Spouse name: Not on file   Number of children: 2   Years of education: 14   Highest education level: Not on file  Occupational History   Not on file  Tobacco Use   Smoking status: Never   Smokeless tobacco: Never  Vaping Use   Vaping Use: Never used  Substance and Sexual Activity   Alcohol use: No   Drug use: No   Sexual activity: Yes    Birth control/protection: Surgical    Comment: TUBAL LIGATION  Other Topics Concern   Not   on file  Social History Narrative   Not on file   Social Determinants of Health   Financial Resource Strain: Not on file  Food Insecurity: Not on file  Transportation Needs: Not on file  Physical Activity: Not on file  Stress: Not on file  Social Connections: Not on file  Intimate Partner Violence: Not on file    Current Outpatient Medications on File Prior to Visit  Medication Sig Dispense Refill   acyclovir (ZOVIRAX) 400 MG tablet TAKE 1 TABLET BY MOUTH 3 TIMES DAILY FOR 5 DAYS AS NEEDED FOR SYMPTOMS 30 tablet 0   aspirin-acetaminophen-caffeine (EXCEDRIN MIGRAINE) 250-250-65 MG tablet      bimatoprost (LATISSE) 0.03 % ophthalmic solution Apply 1 drop via applicator twice a day 5 mL 4   bimatoprost (LATISSE) 0.03 % ophthalmic solution Apply 1 drop via applicator every evening 3 mL 3   Calcium-Vitamin D-Vitamin K 500-100-40 MG-UNT-MCG CHEW Chew by mouth.     Galcanezumab-gnlm (EMGALITY) 120 MG/ML SOAJ Inject 120 mg subcutaneously monthly 1 mL 6   Galcanezumab-gnlm (EMGALITY) 120 MG/ML SOAJ Inject 120 mg  subcutaneously monthly 1 mL 11   Galcanezumab-gnlm (EMGALITY) 120 MG/ML SOAJ Inject 120 mg subcutaneously monthly 1 mL 6   meloxicam (MOBIC) 7.5 MG tablet Take 1 tablet (7.5 mg total) by mouth once daily as needed for Pain 90 tablet 2   Multiple Vitamin (MULTIVITAMIN) capsule Take 1 capsule by mouth daily.     omeprazole (PRILOSEC) 20 MG capsule TAKE 1 CAPSULE BY MOUTH ONCE DAILY 90 capsule 3   omeprazole (PRILOSEC) 20 MG capsule Take 1 capsule (20 mg total) by mouth once daily 90 capsule 3   No current facility-administered medications on file prior to visit.    ROS:  Review of Systems  Constitutional:  Negative for fatigue, fever and unexpected weight change.  Respiratory:  Negative for cough, shortness of breath and wheezing.   Cardiovascular:  Negative for chest pain, palpitations and leg swelling.  Gastrointestinal:  Negative for blood in stool, constipation, diarrhea, nausea and vomiting.  Endocrine: Negative for cold intolerance, heat intolerance and polyuria.  Genitourinary:  Negative for dyspareunia, dysuria, flank pain, frequency, genital sores, hematuria, menstrual problem, pelvic pain, urgency, vaginal bleeding, vaginal discharge and vaginal pain.  Musculoskeletal:  Positive for arthralgias. Negative for back pain, joint swelling and myalgias.  Skin:  Negative for rash.  Neurological:  Negative for dizziness, syncope, light-headedness, numbness and headaches.  Hematological:  Negative for adenopathy.  Psychiatric/Behavioral:  Negative for agitation, confusion, sleep disturbance and suicidal ideas. The patient is not nervous/anxious.      Objective: There were no vitals taken for this visit.   Physical Exam Constitutional:      Appearance: She is well-developed.  Genitourinary:     Vulva normal.     Right Labia: No rash, tenderness or lesions.    Left Labia: No tenderness, lesions or rash.    No vaginal discharge, erythema or tenderness.      Right Adnexa: not  tender and no mass present.    Left Adnexa: not tender and no mass present.    No cervical motion tenderness, friability or polyp.     Uterus is not enlarged or tender.  Breasts:    Right: No mass, nipple discharge, skin change or tenderness.     Left: No mass, nipple discharge, skin change or tenderness.  Neck:     Thyroid: No thyromegaly.  Cardiovascular:     Rate and Rhythm: Normal rate and regular rhythm.       Heart sounds: Normal heart sounds. No murmur heard. Pulmonary:     Effort: Pulmonary effort is normal.     Breath sounds: Normal breath sounds.  Abdominal:     Palpations: Abdomen is soft.     Tenderness: There is no abdominal tenderness. There is no guarding or rebound.  Musculoskeletal:        General: Normal range of motion.     Cervical back: Normal range of motion.  Lymphadenopathy:     Cervical: No cervical adenopathy.  Neurological:     General: No focal deficit present.     Mental Status: She is alert and oriented to person, place, and time.     Cranial Nerves: No cranial nerve deficit.  Skin:    General: Skin is warm and dry.  Psychiatric:        Mood and Affect: Mood normal.        Behavior: Behavior normal.        Thought Content: Thought content normal.        Judgment: Judgment normal.  Vitals reviewed.     Assessment/Plan:  Encounter for annual routine gynecological examination  Encounter for screening mammogram for malignant neoplasm of breast - Plan: MM 3D SCREEN BREAST BILATERAL; pt to sched mammo  Herpes simplex vulvovaginitis - Plan: acyclovir (ZOVIRAX) 400 MG tablet; Rx RF  Screening for colon cancer--Q5 yr colonoscopies at [facility], next on 9/26  Family history of colon cancer--MyRisk neg.    No orders of the defined types were placed in this encounter.             GYN counsel breast self exam, mammography screening, adequate intake of calcium and vitamin D, diet and exercise     F/U  No follow-ups on file.  Franchot Pollitt B. Lavonne Kinderman,  PA-C 04/16/2022 2:09 PM

## 2022-04-17 ENCOUNTER — Encounter: Payer: Self-pay | Admitting: Obstetrics and Gynecology

## 2022-04-17 ENCOUNTER — Ambulatory Visit (INDEPENDENT_AMBULATORY_CARE_PROVIDER_SITE_OTHER): Payer: 59 | Admitting: Obstetrics and Gynecology

## 2022-04-17 ENCOUNTER — Other Ambulatory Visit (HOSPITAL_COMMUNITY)
Admission: RE | Admit: 2022-04-17 | Discharge: 2022-04-17 | Disposition: A | Discharge status: Home / Self Care | Payer: 59 | Source: Ambulatory Visit | Attending: Obstetrics and Gynecology | Admitting: Obstetrics and Gynecology

## 2022-04-17 VITALS — BP 110/70 | Ht 60.0 in | Wt 127.0 lb

## 2022-04-17 DIAGNOSIS — Z1151 Encounter for screening for human papillomavirus (HPV): Secondary | ICD-10-CM

## 2022-04-17 DIAGNOSIS — N871 Moderate cervical dysplasia: Secondary | ICD-10-CM | POA: Diagnosis not present

## 2022-04-17 DIAGNOSIS — A6004 Herpesviral vulvovaginitis: Secondary | ICD-10-CM | POA: Diagnosis not present

## 2022-04-17 DIAGNOSIS — Z1231 Encounter for screening mammogram for malignant neoplasm of breast: Secondary | ICD-10-CM

## 2022-04-17 DIAGNOSIS — Z124 Encounter for screening for malignant neoplasm of cervix: Secondary | ICD-10-CM

## 2022-04-17 DIAGNOSIS — Z01419 Encounter for gynecological examination (general) (routine) without abnormal findings: Secondary | ICD-10-CM

## 2022-04-17 NOTE — Patient Instructions (Addendum)
I value your feedback and you entrusting us with your care. If you get a [facility] patient survey, I would appreciate you taking the time to let us know about your experience today. Thank you!  [facility]: [phone]      

## 2022-04-20 LAB — CYTOLOGY - PAP
Adequacy: ABSENT
Comment: NEGATIVE
Diagnosis: NEGATIVE
High risk HPV: NEGATIVE

## 2022-04-23 ENCOUNTER — Other Ambulatory Visit: Payer: Self-pay

## 2022-04-27 ENCOUNTER — Other Ambulatory Visit: Payer: Self-pay

## 2022-04-27 DIAGNOSIS — L821 Other seborrheic keratosis: Secondary | ICD-10-CM | POA: Diagnosis not present

## 2022-05-02 ENCOUNTER — Other Ambulatory Visit: Payer: Self-pay

## 2022-05-03 ENCOUNTER — Other Ambulatory Visit: Payer: Self-pay

## 2022-05-08 ENCOUNTER — Other Ambulatory Visit: Payer: Self-pay

## 2022-05-11 ENCOUNTER — Other Ambulatory Visit: Payer: Self-pay

## 2022-05-11 DIAGNOSIS — R519 Headache, unspecified: Secondary | ICD-10-CM | POA: Diagnosis not present

## 2022-05-11 DIAGNOSIS — H53149 Visual discomfort, unspecified: Secondary | ICD-10-CM | POA: Diagnosis not present

## 2022-05-11 DIAGNOSIS — R110 Nausea: Secondary | ICD-10-CM | POA: Diagnosis not present

## 2022-05-11 DIAGNOSIS — G43009 Migraine without aura, not intractable, without status migrainosus: Secondary | ICD-10-CM | POA: Diagnosis not present

## 2022-05-11 MED ORDER — BUTALBITAL-APAP-CAFFEINE 50-325-40 MG PO TABS
ORAL_TABLET | ORAL | 1 refills | Status: AC
  Filled 2022-05-11 – 2022-08-01 (×2): qty 30, 30d supply, fill #0

## 2022-05-11 MED ORDER — EMGALITY 120 MG/ML ~~LOC~~ SOAJ
SUBCUTANEOUS | 11 refills | Status: DC
  Filled 2022-05-11: qty 1, 30d supply, fill #0
  Filled 2022-05-23: qty 1, 28d supply, fill #0
  Filled 2022-05-28: qty 1, 30d supply, fill #0
  Filled 2022-06-23: qty 1, 30d supply, fill #1
  Filled 2022-07-23 – 2022-08-01 (×4): qty 1, 30d supply, fill #2
  Filled 2022-08-26: qty 1, 30d supply, fill #3
  Filled 2022-09-23: qty 1, 30d supply, fill #4
  Filled 2022-10-22: qty 1, 30d supply, fill #5
  Filled 2022-11-18: qty 1, 30d supply, fill #6
  Filled 2022-12-17: qty 1, 30d supply, fill #7
  Filled 2023-01-11: qty 1, 30d supply, fill #8
  Filled 2023-02-10: qty 1, 30d supply, fill #9
  Filled 2023-03-09: qty 1, 30d supply, fill #10
  Filled 2023-04-07: qty 1, 30d supply, fill #11

## 2022-05-23 ENCOUNTER — Other Ambulatory Visit: Payer: Self-pay

## 2022-05-25 ENCOUNTER — Other Ambulatory Visit: Payer: Self-pay

## 2022-05-28 ENCOUNTER — Other Ambulatory Visit: Payer: Self-pay

## 2022-06-05 ENCOUNTER — Ambulatory Visit
Admission: RE | Admit: 2022-06-05 | Discharge: 2022-06-05 | Disposition: A | Discharge status: Home / Self Care | Payer: 59 | Source: Ambulatory Visit | Attending: Obstetrics and Gynecology | Admitting: Obstetrics and Gynecology

## 2022-06-05 DIAGNOSIS — Z1231 Encounter for screening mammogram for malignant neoplasm of breast: Secondary | ICD-10-CM | POA: Diagnosis not present

## 2022-06-11 ENCOUNTER — Other Ambulatory Visit (HOSPITAL_COMMUNITY): Payer: Self-pay

## 2022-06-12 ENCOUNTER — Encounter (HOSPITAL_COMMUNITY): Payer: Self-pay

## 2022-06-12 ENCOUNTER — Other Ambulatory Visit (HOSPITAL_COMMUNITY): Payer: Self-pay

## 2022-06-24 ENCOUNTER — Other Ambulatory Visit: Payer: Self-pay

## 2022-06-25 ENCOUNTER — Other Ambulatory Visit: Payer: Self-pay

## 2022-06-29 ENCOUNTER — Other Ambulatory Visit: Payer: Self-pay

## 2022-06-29 DIAGNOSIS — Z0000 Encounter for general adult medical examination without abnormal findings: Secondary | ICD-10-CM | POA: Diagnosis not present

## 2022-06-29 DIAGNOSIS — G43109 Migraine with aura, not intractable, without status migrainosus: Secondary | ICD-10-CM | POA: Diagnosis not present

## 2022-06-29 DIAGNOSIS — E538 Deficiency of other specified B group vitamins: Secondary | ICD-10-CM | POA: Diagnosis not present

## 2022-06-29 MED ORDER — MELOXICAM 7.5 MG PO TABS
7.5000 mg | ORAL_TABLET | Freq: Two times a day (BID) | ORAL | 3 refills | Status: DC | PRN
  Filled 2022-06-29 – 2022-08-16 (×2): qty 180, 90d supply, fill #0
  Filled 2022-12-17: qty 180, 90d supply, fill #1
  Filled 2023-04-07: qty 180, 90d supply, fill #2

## 2022-07-24 ENCOUNTER — Other Ambulatory Visit: Payer: Self-pay

## 2022-07-27 ENCOUNTER — Other Ambulatory Visit: Payer: Self-pay

## 2022-08-01 ENCOUNTER — Other Ambulatory Visit: Payer: Self-pay

## 2022-08-16 ENCOUNTER — Other Ambulatory Visit: Payer: Self-pay

## 2022-08-17 ENCOUNTER — Other Ambulatory Visit: Payer: Self-pay

## 2022-08-20 ENCOUNTER — Other Ambulatory Visit: Payer: Self-pay

## 2022-08-21 ENCOUNTER — Other Ambulatory Visit: Payer: Self-pay

## 2022-08-27 ENCOUNTER — Other Ambulatory Visit: Payer: Self-pay

## 2022-08-28 ENCOUNTER — Other Ambulatory Visit: Payer: Self-pay

## 2022-09-23 ENCOUNTER — Other Ambulatory Visit: Payer: Self-pay

## 2022-09-24 ENCOUNTER — Other Ambulatory Visit: Payer: Self-pay

## 2022-09-26 ENCOUNTER — Other Ambulatory Visit: Payer: Self-pay

## 2022-09-26 MED ORDER — OMEPRAZOLE 20 MG PO CPDR
20.0000 mg | DELAYED_RELEASE_CAPSULE | Freq: Every day | ORAL | 1 refills | Status: DC
  Filled 2022-09-26: qty 90, 90d supply, fill #0
  Filled 2023-02-26: qty 90, 90d supply, fill #1

## 2022-12-17 ENCOUNTER — Other Ambulatory Visit: Payer: Self-pay

## 2022-12-18 ENCOUNTER — Other Ambulatory Visit: Payer: Self-pay

## 2022-12-20 ENCOUNTER — Other Ambulatory Visit: Payer: Self-pay

## 2022-12-28 ENCOUNTER — Ambulatory Visit (INDEPENDENT_AMBULATORY_CARE_PROVIDER_SITE_OTHER): Payer: 59

## 2022-12-28 ENCOUNTER — Encounter: Payer: Self-pay | Admitting: Emergency Medicine

## 2022-12-28 ENCOUNTER — Ambulatory Visit
Admission: EM | Admit: 2022-12-28 | Discharge: 2022-12-28 | Disposition: A | Discharge status: Home / Self Care | Payer: 59 | Attending: Physician Assistant | Admitting: Physician Assistant

## 2022-12-28 DIAGNOSIS — S93492A Sprain of other ligament of left ankle, initial encounter: Secondary | ICD-10-CM | POA: Diagnosis not present

## 2022-12-28 DIAGNOSIS — M25572 Pain in left ankle and joints of left foot: Secondary | ICD-10-CM | POA: Diagnosis not present

## 2022-12-28 NOTE — ED Triage Notes (Signed)
Patient states that she rolled her left ankle about 6 weeks ago while she walking down the steps after getting her hair done.  Patient c/o ongoing pain on the outside of her left ankle.

## 2022-12-28 NOTE — ED Provider Notes (Signed)
[facility]    CSN: [id number] Arrival date & time: 12/28/22  1212      History   Chief Complaint Chief Complaint  Patient presents with   Ankle Pain    Entered by patient   Fall    HPI Meghan Green is a 57 y.o. female.   HPI She is in today for evaluation of her left ankle.  She reports that she stepped in her ankle rolled out 6 weeks ago.  She reports that she rested her ankle for 2 days because of increased pain with weight bearing.  She has been wearing an Ace wrap while working.  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  She reports that about 8 hours into that she ankle started to ache.  She reports that she initial swelling that has resolved mostly.  She does have some tenderness to the outer side of the ankle that radiates into the heel and rotates inside.  She denies previous history of ankle problems.  Past Medical History:  Diagnosis Date   Asthma    Blighted ovum    X2   BRCA negative 01/2018   MyRisk neg except APC VUS; IBIS=9.7%, riskscore=9.6%   Cervical dysplasia    CIN II   Family history of colon cancer 2013   STRONG FH ON BOTH SIDES. COLARIS TESTING REC. PT NEVER CAME FOR LAB DRAW   GERD (gastroesophageal reflux disease)    Herpes, genital    History of mammogram 12/16/14; 02/22/16   BIRAD 1; NEG   History of Papanicolaou smear of cervix 12/23/2014   -/-   Migraine    Mitral valve prolapse    Pap smear abnormality of cervix with HGSIL 2000    Patient Active Problem List   Diagnosis Date Noted   Family history of colon cancer 03/31/2021   Herpes, genital 02/05/2017    Past Surgical History:  Procedure Laterality Date   CERVICAL BIOPSY  W/ LOOP ELECTRODE EXCISION  2001   mod dysp   CERVICAL CERCLAGE     x2   CESAREAN SECTION  2000   COLONOSCOPY  2011   wnl; repeat due 2015 c Dr. Mechele Collin   COLPOSCOPY  2000   DILATION AND CURETTAGE OF UTERUS     x2   REFRACTIVE SURGERY  07/2016   Right eye   SPINAL FUSION  1990    TUBAL LIGATION      OB History     Gravida  3   Para  2   Term  1   Preterm  1   AB  1   Living  2      SAB  1   IAB      Ectopic      Multiple      Live Births  2            Home Medications    Prior to Admission medications   Medication Sig Start Date End Date Taking? Authorizing Provider  Galcanezumab-gnlm [facility]) 120 MG/ML SOAJ Inject 120 mg subcutaneously monthly 03/27/22  Yes   meloxicam (MOBIC) 7.5 MG tablet Take 1 tablet (7.5 mg total) by mouth once daily as needed for Pain 07/31/21  Yes   Multiple Vitamin (MULTIVITAMIN) capsule Take 1 capsule by mouth daily.   Yes [provider]  omeprazole (PRILOSEC) 20 MG capsule Take 1 capsule (20 mg total) by mouth once daily 07/31/21  Yes   acyclovir (ZOVIRAX) 400 MG tablet  TAKE 1 TABLET BY MOUTH 3 TIMES DAILY FOR 5 DAYS AS NEEDED FOR SYMPTOMS 03/07/22   Copland, Ilona Sorrel, PA-C  aspirin-acetaminophen-caffeine (EXCEDRIN MIGRAINE) [phone] MG tablet     [provider]  bimatoprost (LATISSE) 0.03 % ophthalmic solution Apply 1 drop via applicator every evening 01/30/22     butalbital-acetaminophen-caffeine (FIORICET) 50-325-40 MG tablet Take 1 tablet by mouth at headache onset. Can repeat after 4 hours. No more than 2 tabs in 24 hours. Do not take more than 2-3 times a week MAXIMUM. 05/11/22     Calcium-Vitamin D-Vitamin K 500-100-40 MG-UNT-MCG CHEW Chew by mouth.    [provider]  Galcanezumab-gnlm (EMGALITY) 120 MG/ML SOAJ Inject 120 mg subcutaneously monthly 05/11/22     meloxicam (MOBIC) 7.5 MG tablet Take 1 tablet (7.5 mg total) by mouth 2 (two) times daily as needed for pain. 06/29/22     omeprazole (PRILOSEC) 20 MG capsule Take 1 capsule (20 mg total) by mouth daily. 09/26/22       Family History Family History  Problem Relation Age of Onset   Colon cancer Other 51       3 mat relatives   Colon cancer Other 50       COLON   Breast cancer Neg Hx     Social History Social History    Tobacco Use   Smoking status: Never   Smokeless tobacco: Never  Vaping Use   Vaping Use: Never used  Substance Use Topics   Alcohol use: No   Drug use: No     Allergies   Sumatriptan, Erythromycin, Morphine, Other, Fentanyl, and Fluoxetine   Review of Systems Review of Systems   Physical Exam Triage Vital Signs ED Triage Vitals  Enc Vitals Group     BP 12/28/22 1224 (!) 142/76     Pulse Rate 12/28/22 1224 67     Resp 12/28/22 1224 14     Temp 12/28/22 1224 98.1 F (36.7 C)     Temp Source 12/28/22 1224 Oral     SpO2 12/28/22 1224 100 %     Weight 12/28/22 1221 125 lb (56.7 kg)     Height 12/28/22 1221 5' (1.524 m)     Head Circumference --      Peak Flow --      Pain Score 12/28/22 1221 3     Pain Loc --      Pain Edu? --      Excl. in GC? --    No data found.  Updated Vital Signs BP (!) 142/76 (BP Location: Left Arm)   Pulse 67   Temp 98.1 F (36.7 C) (Oral)   Resp 14   Ht 5' (1.524 m)   Wt 125 lb (56.7 kg)   LMP 11/01/2021 (Exact Date)   SpO2 100%   BMI 24.41 kg/m   Visual Acuity Right Eye Distance:   Left Eye Distance:   Bilateral Distance:    Right Eye Near:   Left Eye Near:    Bilateral Near:     Physical Exam Constitutional:      Comments: frail  HENT:     Head: Normocephalic.  Cardiovascular:     Rate and Rhythm: Normal rate.  Pulmonary:     Effort: Pulmonary effort is normal.  Musculoskeletal:        General: Tenderness present.     Right lower leg: No edema.     Left lower leg: No edema.     Left ankle: Tenderness present.  Normal range of motion.     Comments: Tenderness with dorsiflexion  Neurological:     Mental Status: She is alert.      UC Treatments / Results  Labs (all labs ordered are listed, but only abnormal results are displayed) Labs Reviewed - No data to display  EKG   Radiology DG Ankle Complete Left  Result Date: 12/28/2022 CLINICAL DATA:  Left ankle pain, fall 6 weeks ago EXAM: LEFT ANKLE  COMPLETE - 3 VIEW COMPARISON:  None Available. FINDINGS: There is no evidence of fracture, dislocation, or joint effusion. There is no evidence of arthropathy or other focal bone abnormality. Soft tissues are unremarkable. IMPRESSION: Negative. Electronically Signed   By: Allegra Lai M.D.   On: 12/28/2022 13:04    Procedures Procedures (including critical care time)  Medications Ordered in UC Medications - No data to display  Initial Impression / Assessment and Plan / UC Course  I have reviewed the triage vital signs and the nursing notes.  Pertinent labs & imaging results that were available during my care of the patient were reviewed by me and considered in my medical decision making (see chart for details).     Ankle pain Final Clinical Impressions(s) / UC Diagnoses   Final diagnoses:  Sprain of posterior talofibular ligament of left ankle, initial encounter     Discharge Instructions      Overall your x-ray is negative for any acute abnormalities. Recop We do recommend that you use over the counter Tylenol or Ibuprofen as directed (do not exceed daily limits). We do encourage you to follow up with your primary care provider if your symptoms persist for further evaluation with additional imaging.    ED Prescriptions   None    PDMP not reviewed this encounter.   Thad Ranger [place], [place] 12/28/22 1326

## 2022-12-28 NOTE — Discharge Instructions (Addendum)
Overall your x-ray is negative for any acute abnormalities. Recop We do recommend that you use over the counter Tylenol or Ibuprofen as directed (do not exceed daily limits). We do encourage you to follow up with your primary care provider if your symptoms persist for further evaluation with additional imaging.

## 2023-01-11 ENCOUNTER — Encounter: Payer: Self-pay | Admitting: Pharmacist

## 2023-01-11 ENCOUNTER — Other Ambulatory Visit: Payer: Self-pay

## 2023-01-14 ENCOUNTER — Other Ambulatory Visit: Payer: Self-pay | Admitting: Obstetrics and Gynecology

## 2023-01-14 ENCOUNTER — Other Ambulatory Visit: Payer: Self-pay

## 2023-01-14 DIAGNOSIS — A6004 Herpesviral vulvovaginitis: Secondary | ICD-10-CM

## 2023-01-15 ENCOUNTER — Other Ambulatory Visit: Payer: Self-pay

## 2023-01-15 MED ORDER — ACYCLOVIR 400 MG PO TABS
400.0000 mg | ORAL_TABLET | Freq: Three times a day (TID) | ORAL | 0 refills | Status: DC
  Filled 2023-01-15: qty 30, 10d supply, fill #0

## 2023-02-01 DIAGNOSIS — H524 Presbyopia: Secondary | ICD-10-CM | POA: Diagnosis not present

## 2023-02-27 ENCOUNTER — Other Ambulatory Visit (HOSPITAL_COMMUNITY): Payer: Self-pay

## 2023-02-27 ENCOUNTER — Other Ambulatory Visit: Payer: Self-pay

## 2023-02-28 ENCOUNTER — Other Ambulatory Visit (HOSPITAL_COMMUNITY): Payer: Self-pay

## 2023-04-08 ENCOUNTER — Other Ambulatory Visit: Payer: Self-pay

## 2023-04-24 NOTE — Progress Notes (Unsigned)
No chief complaint on file.    HPI:      [name] is a 57 y.o. [id number] who LMP was Patient's last menstrual period was 11/01/2021 (exact date)., presents today for her annual examination.  Her menses are irregular now, last one 4/23. Was monthly prior to 4/23, lasting 3 days. No BTB. No dysmen, no vasomotor sx.    Sex activity: single partner, contraception - tubal ligation. No pain/bleeding.  Last Pap: 04/17/22 Results were: no abnormalities /neg HPV DNA . 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 Pap due Q3 yrs for at least 25 yrs per [facility].  Hx of STDs: HSV, HPV. She takes acyclovir prn outbreaks which are rare. She does not need Rx RF today.   Last mammogram: 06/05/22 Results were: normal--routine follow-up in 12 months There is no FH of breast cancer. There is no FH of ovarian cancer. The patient does do self-breast exams. She has a strong FH of colon cancer in 3rd degree relatives on mat and pat side. MyRisk neg except APC VUS 2019. Riskscore=9.6%.  She gets colonoscopies Q79yrs with Dr. Mechele Collin, last one 03/2020.   Tobacco use: The patient denies current or previous tobacco use. Alcohol use: none Drug use: none Exercise: very active   She does get adequate calcium and Vitamin D in her diet.   She has labs with PCP.    Past Medical History:  Diagnosis Date   Asthma    Blighted ovum    X2   BRCA negative 01/2018   MyRisk neg except APC VUS; IBIS=9.7%, riskscore=9.6%   Cervical dysplasia    CIN II   Family history of colon cancer 2013   STRONG FH ON BOTH SIDES. COLARIS TESTING REC. PT NEVER CAME FOR LAB DRAW   GERD (gastroesophageal reflux disease)    Herpes, genital    History of mammogram 12/16/14; 02/22/16   BIRAD 1; NEG   History of Papanicolaou smear of cervix 12/23/2014   -/-   Migraine    Mitral valve prolapse    Pap smear abnormality of cervix with HGSIL 2000    Past Surgical History:  Procedure Laterality Date   CERVICAL BIOPSY  W/ LOOP ELECTRODE  EXCISION  2001   mod dysp   CERVICAL CERCLAGE     x2   CESAREAN SECTION  2000   COLONOSCOPY  2011   wnl; repeat due 2015 c Dr. Mechele Collin   COLPOSCOPY  2000   DILATION AND CURETTAGE OF UTERUS     x2   REFRACTIVE SURGERY  07/2016   Right eye   SPINAL FUSION  1990   TUBAL LIGATION      Family History  Problem Relation Age of Onset   Colon cancer Other 51       3 mat relatives   Colon cancer Other 27       COLON   Breast cancer Neg Hx     Social History   Socioeconomic History   Marital status: Married    Spouse name: Not on file   Number of children: 2   Years of education: 14   Highest education level: Not on file  Occupational History   Not on file  Tobacco Use   Smoking status: Never   Smokeless tobacco: Never  Vaping Use   Vaping status: Never Used  Substance and Sexual Activity   Alcohol use: No   Drug use: No   Sexual activity: Yes    Birth control/protection:  Surgical    Comment: TUBAL LIGATION  Other Topics Concern   Not on file  Social History Narrative   Not on file   Social Determinants of Health   Financial Resource Strain: Not on file  Food Insecurity: Not on file  Transportation Needs: Not on file  Physical Activity: Not on file  Stress: Not on file  Social Connections: Not on file  Intimate Partner Violence: Not on file    Current Outpatient Medications on File Prior to Visit  Medication Sig Dispense Refill   acyclovir (ZOVIRAX) 400 MG tablet Take 1 tablet (400 mg total) by mouth 3 (three) times daily for 5 days as needed for symptoms 30 tablet 0   aspirin-acetaminophen-caffeine (EXCEDRIN MIGRAINE) 250-250-65 MG tablet      bimatoprost (LATISSE) 0.03 % ophthalmic solution Apply 1 drop via applicator every evening 3 mL 3   butalbital-acetaminophen-caffeine (FIORICET) 50-325-40 MG tablet Take 1 tablet by mouth at headache onset. Can repeat after 4 hours. No more than 2 tabs in 24 hours. Do not take more than 2-3 times a week MAXIMUM. 30  tablet 1   Calcium-Vitamin D-Vitamin K 500-100-40 MG-UNT-MCG CHEW Chew by mouth.     Galcanezumab-gnlm (EMGALITY) 120 MG/ML SOAJ Inject 120 mg subcutaneously monthly 1 mL 6   Galcanezumab-gnlm (EMGALITY) 120 MG/ML SOAJ Inject 120 mg subcutaneously monthly 1 mL 11   meloxicam (MOBIC) 7.5 MG tablet Take 1 tablet (7.5 mg total) by mouth once daily as needed for Pain 90 tablet 2   meloxicam (MOBIC) 7.5 MG tablet Take 1 tablet (7.5 mg total) by mouth 2 (two) times daily as needed for pain. 180 tablet 3   Multiple Vitamin (MULTIVITAMIN) capsule Take 1 capsule by mouth daily.     omeprazole (PRILOSEC) 20 MG capsule Take 1 capsule (20 mg total) by mouth once daily 90 capsule 3   omeprazole (PRILOSEC) 20 MG capsule Take 1 capsule (20 mg total) by mouth daily. 90 capsule 1   No current facility-administered medications on file prior to visit.    ROS:  Review of Systems  Constitutional:  Negative for fatigue, fever and unexpected weight change.  Respiratory:  Negative for cough, shortness of breath and wheezing.   Cardiovascular:  Negative for chest pain, palpitations and leg swelling.  Gastrointestinal:  Negative for blood in stool, constipation, diarrhea, nausea and vomiting.  Endocrine: Negative for cold intolerance, heat intolerance and polyuria.  Genitourinary:  Negative for dyspareunia, dysuria, flank pain, frequency, genital sores, hematuria, menstrual problem, pelvic pain, urgency, vaginal bleeding, vaginal discharge and vaginal pain.  Musculoskeletal:  Positive for arthralgias. Negative for back pain, joint swelling and myalgias.  Skin:  Negative for rash.  Neurological:  Negative for dizziness, syncope, light-headedness, numbness and headaches.  Hematological:  Negative for adenopathy.  Psychiatric/Behavioral:  Negative for agitation, confusion, sleep disturbance and suicidal ideas. The patient is not nervous/anxious.      Objective: LMP 11/01/2021 (Exact Date)    Physical  Exam Constitutional:      Appearance: She is well-developed.  Genitourinary:     Vulva normal.     Right Labia: No rash, tenderness or lesions.    Left Labia: No tenderness, lesions or rash.    No vaginal discharge, erythema or tenderness.      Right Adnexa: not tender and no mass present.    Left Adnexa: not tender and no mass present.    No cervical motion tenderness, friability or polyp.     Uterus is not enlarged or tender.  Breasts:    Right: No mass, nipple discharge, skin change or tenderness.     Left: No mass, nipple discharge, skin change or tenderness.  Neck:     Thyroid: No thyromegaly.  Cardiovascular:     Rate and Rhythm: Normal rate and regular rhythm.     Heart sounds: Normal heart sounds. No murmur heard. Pulmonary:     Effort: Pulmonary effort is normal.     Breath sounds: Normal breath sounds.  Abdominal:     Palpations: Abdomen is soft.     Tenderness: There is no abdominal tenderness. There is no guarding or rebound.  Musculoskeletal:        General: Normal range of motion.     Cervical back: Normal range of motion.  Lymphadenopathy:     Cervical: No cervical adenopathy.  Neurological:     General: No focal deficit present.     Mental Status: She is alert and oriented to person, place, and time.     Cranial Nerves: No cranial nerve deficit.  Skin:    General: Skin is warm and dry.  Psychiatric:        Mood and Affect: Mood normal.        Behavior: Behavior normal.        Thought Content: Thought content normal.        Judgment: Judgment normal.  Vitals reviewed.     Assessment/Plan: Encounter for annual routine gynecological examination  Cervical cancer screening - Plan: Cytology - PAP  Screening for HPV (human papillomavirus) - Plan: Cytology - PAP  Dysplasia of cervix, high grade CIN 2 - Plan: Cytology - PAP; repeat pap today  Encounter for screening mammogram for malignant neoplasm of breast - Plan: MM 3D SCREEN BREAST BILATERAL; pt to  schedule mammo  Herpes simplex vulvovaginitis--will call for acyclovir RF prn.   Screening for colon cancer--Q5 yr colonoscopies at [facility], next on 9/26  Family history of colon cancer--MyRisk neg.        GYN counsel breast self exam, mammography screening, adequate intake of calcium and vitamin D, diet and exercise     F/U  No follow-ups on file.  Shemiah Rosch B. Amahd Morino, PA-C 04/24/2023 2:40 PM

## 2023-04-25 ENCOUNTER — Encounter: Payer: Self-pay | Admitting: Obstetrics and Gynecology

## 2023-04-25 ENCOUNTER — Other Ambulatory Visit: Payer: Self-pay

## 2023-04-25 ENCOUNTER — Ambulatory Visit: Payer: 59 | Admitting: Obstetrics and Gynecology

## 2023-04-25 VITALS — BP 118/64 | Ht 60.0 in | Wt 130.0 lb

## 2023-04-25 DIAGNOSIS — Z79890 Hormone replacement therapy (postmenopausal): Secondary | ICD-10-CM

## 2023-04-25 DIAGNOSIS — Z01419 Encounter for gynecological examination (general) (routine) without abnormal findings: Secondary | ICD-10-CM

## 2023-04-25 DIAGNOSIS — A6004 Herpesviral vulvovaginitis: Secondary | ICD-10-CM

## 2023-04-25 DIAGNOSIS — N951 Menopausal and female climacteric states: Secondary | ICD-10-CM

## 2023-04-25 DIAGNOSIS — Z1231 Encounter for screening mammogram for malignant neoplasm of breast: Secondary | ICD-10-CM

## 2023-04-25 MED ORDER — ACYCLOVIR 400 MG PO TABS
400.0000 mg | ORAL_TABLET | Freq: Three times a day (TID) | ORAL | 0 refills | Status: DC
Start: 2023-04-25 — End: 2024-04-27
  Filled 2023-04-25: qty 30, 10d supply, fill #0

## 2023-04-25 NOTE — Patient Instructions (Signed)
I value your feedback and you entrusting us with your care. If you get a [facility] patient survey, I would appreciate you taking the time to let us know about your experience today. Thank you!  [facility] ([place])--[phone]  

## 2023-05-06 ENCOUNTER — Other Ambulatory Visit: Payer: Self-pay

## 2023-05-06 MED ORDER — EMGALITY 120 MG/ML ~~LOC~~ SOAJ
120.0000 mg | SUBCUTANEOUS | 0 refills | Status: DC
  Filled 2023-05-06: qty 1, 30d supply, fill #0

## 2023-05-07 ENCOUNTER — Other Ambulatory Visit: Payer: Self-pay

## 2023-05-10 ENCOUNTER — Other Ambulatory Visit: Payer: Self-pay

## 2023-05-10 DIAGNOSIS — R110 Nausea: Secondary | ICD-10-CM | POA: Diagnosis not present

## 2023-05-10 DIAGNOSIS — H53149 Visual discomfort, unspecified: Secondary | ICD-10-CM | POA: Diagnosis not present

## 2023-05-10 DIAGNOSIS — G43009 Migraine without aura, not intractable, without status migrainosus: Secondary | ICD-10-CM | POA: Diagnosis not present

## 2023-05-10 DIAGNOSIS — R519 Headache, unspecified: Secondary | ICD-10-CM | POA: Diagnosis not present

## 2023-05-10 MED ORDER — BUTALBITAL-APAP-CAFFEINE 50-325-40 MG PO TABS
1.0000 | ORAL_TABLET | ORAL | 1 refills | Status: AC
  Filled 2023-05-10 – 2023-07-08 (×2): qty 30, 30d supply, fill #0

## 2023-05-10 MED ORDER — EMGALITY 120 MG/ML ~~LOC~~ SOAJ
120.0000 mg | SUBCUTANEOUS | 11 refills | Status: DC
  Filled 2023-05-10: qty 1, 28d supply, fill #0
  Filled 2023-06-02: qty 1, 30d supply, fill #0
  Filled 2023-06-30: qty 1, 30d supply, fill #1
  Filled 2023-07-28: qty 1, 30d supply, fill #2
  Filled 2023-08-01 – 2023-08-02 (×2): qty 1, 30d supply, fill #0
  Filled 2023-08-26 – 2023-09-09 (×4): qty 1, 30d supply, fill #1
  Filled 2023-10-05: qty 1, 30d supply, fill #2
  Filled 2023-11-03: qty 1, 30d supply, fill #3
  Filled 2023-11-30: qty 1, 30d supply, fill #4
  Filled 2023-12-29: qty 1, 30d supply, fill #5
  Filled 2024-01-27: qty 1, 30d supply, fill #6
  Filled 2024-02-23: qty 1, 30d supply, fill #7
  Filled 2024-03-23: qty 1, 30d supply, fill #8
  Filled 2024-04-18: qty 1, 30d supply, fill #9
  Filled ????-??-??: fill #2

## 2023-05-22 ENCOUNTER — Other Ambulatory Visit: Payer: Self-pay

## 2023-06-02 ENCOUNTER — Other Ambulatory Visit (HOSPITAL_COMMUNITY): Payer: Self-pay

## 2023-06-03 ENCOUNTER — Other Ambulatory Visit: Payer: Self-pay

## 2023-06-03 ENCOUNTER — Other Ambulatory Visit (HOSPITAL_COMMUNITY): Payer: Self-pay

## 2023-06-04 ENCOUNTER — Other Ambulatory Visit (HOSPITAL_COMMUNITY): Payer: Self-pay

## 2023-06-04 MED ORDER — OMEPRAZOLE 20 MG PO CPDR
20.0000 mg | DELAYED_RELEASE_CAPSULE | Freq: Every day | ORAL | 1 refills | Status: DC
  Filled 2023-06-04 (×2): qty 90, 90d supply, fill #0
  Filled 2023-08-26: qty 90, 90d supply, fill #1

## 2023-06-05 ENCOUNTER — Other Ambulatory Visit (HOSPITAL_COMMUNITY): Payer: Self-pay

## 2023-06-05 ENCOUNTER — Other Ambulatory Visit: Payer: Self-pay

## 2023-07-01 DIAGNOSIS — Z0000 Encounter for general adult medical examination without abnormal findings: Secondary | ICD-10-CM | POA: Diagnosis not present

## 2023-07-01 DIAGNOSIS — Z23 Encounter for immunization: Secondary | ICD-10-CM | POA: Diagnosis not present

## 2023-07-01 DIAGNOSIS — G43109 Migraine with aura, not intractable, without status migrainosus: Secondary | ICD-10-CM | POA: Diagnosis not present

## 2023-07-01 DIAGNOSIS — E538 Deficiency of other specified B group vitamins: Secondary | ICD-10-CM | POA: Diagnosis not present

## 2023-07-01 DIAGNOSIS — Z2821 Immunization not carried out because of patient refusal: Secondary | ICD-10-CM | POA: Diagnosis not present

## 2023-07-05 ENCOUNTER — Other Ambulatory Visit (HOSPITAL_COMMUNITY): Payer: Self-pay

## 2023-07-05 ENCOUNTER — Encounter (HOSPITAL_COMMUNITY): Payer: Self-pay

## 2023-07-08 ENCOUNTER — Other Ambulatory Visit: Payer: Self-pay

## 2023-07-09 ENCOUNTER — Other Ambulatory Visit: Payer: Self-pay

## 2023-07-09 ENCOUNTER — Ambulatory Visit
Admission: RE | Admit: 2023-07-09 | Discharge: 2023-07-09 | Disposition: A | Discharge status: Home / Self Care | Payer: 59 | Source: Ambulatory Visit | Attending: Obstetrics and Gynecology | Admitting: Obstetrics and Gynecology

## 2023-07-09 DIAGNOSIS — Z1231 Encounter for screening mammogram for malignant neoplasm of breast: Secondary | ICD-10-CM | POA: Insufficient documentation

## 2023-07-09 MED ORDER — MELOXICAM 7.5 MG PO TABS
7.5000 mg | ORAL_TABLET | Freq: Two times a day (BID) | ORAL | 3 refills | Status: DC | PRN
  Filled 2023-07-09: qty 180, 90d supply, fill #0
  Filled 2023-10-17: qty 180, 90d supply, fill #1
  Filled 2024-01-17: qty 180, 90d supply, fill #2
  Filled 2024-05-07: qty 180, 90d supply, fill #3

## 2023-08-01 ENCOUNTER — Other Ambulatory Visit (HOSPITAL_COMMUNITY): Payer: Self-pay

## 2023-08-02 ENCOUNTER — Other Ambulatory Visit: Payer: Self-pay

## 2023-08-02 ENCOUNTER — Other Ambulatory Visit (HOSPITAL_COMMUNITY): Payer: Self-pay

## 2023-08-26 ENCOUNTER — Other Ambulatory Visit (HOSPITAL_COMMUNITY): Payer: Self-pay

## 2023-08-26 ENCOUNTER — Other Ambulatory Visit: Payer: Self-pay

## 2023-09-02 ENCOUNTER — Encounter: Payer: Self-pay | Admitting: Pharmacist

## 2023-09-02 ENCOUNTER — Other Ambulatory Visit: Payer: Self-pay

## 2023-09-04 ENCOUNTER — Other Ambulatory Visit (HOSPITAL_COMMUNITY): Payer: Self-pay

## 2023-09-04 ENCOUNTER — Other Ambulatory Visit: Payer: Self-pay

## 2023-09-06 ENCOUNTER — Other Ambulatory Visit (HOSPITAL_COMMUNITY): Payer: Self-pay

## 2023-09-09 ENCOUNTER — Other Ambulatory Visit (HOSPITAL_COMMUNITY): Payer: Self-pay

## 2023-09-10 ENCOUNTER — Other Ambulatory Visit (HOSPITAL_COMMUNITY): Payer: Self-pay

## 2023-10-07 ENCOUNTER — Other Ambulatory Visit: Payer: Self-pay

## 2023-10-17 ENCOUNTER — Other Ambulatory Visit (HOSPITAL_COMMUNITY): Payer: Self-pay

## 2023-11-04 ENCOUNTER — Other Ambulatory Visit (HOSPITAL_COMMUNITY): Payer: Self-pay

## 2023-11-04 ENCOUNTER — Encounter: Payer: Self-pay | Admitting: Pharmacist

## 2023-11-04 ENCOUNTER — Other Ambulatory Visit: Payer: Self-pay

## 2023-11-30 ENCOUNTER — Other Ambulatory Visit (HOSPITAL_COMMUNITY): Payer: Self-pay

## 2023-12-01 ENCOUNTER — Other Ambulatory Visit (HOSPITAL_COMMUNITY): Payer: Self-pay

## 2023-12-02 ENCOUNTER — Other Ambulatory Visit: Payer: Self-pay

## 2023-12-02 ENCOUNTER — Other Ambulatory Visit (HOSPITAL_COMMUNITY): Payer: Self-pay

## 2023-12-02 MED ORDER — OMEPRAZOLE 20 MG PO CPDR
20.0000 mg | DELAYED_RELEASE_CAPSULE | Freq: Every day | ORAL | 1 refills | Status: DC
Start: 2023-12-02 — End: 2024-06-15
  Filled 2023-12-02: qty 90, 90d supply, fill #0
  Filled 2024-03-15: qty 90, 90d supply, fill #1

## 2023-12-03 ENCOUNTER — Other Ambulatory Visit (HOSPITAL_COMMUNITY): Payer: Self-pay

## 2023-12-04 ENCOUNTER — Other Ambulatory Visit (HOSPITAL_COMMUNITY): Payer: Self-pay

## 2023-12-24 ENCOUNTER — Other Ambulatory Visit: Payer: Self-pay

## 2023-12-24 MED ORDER — BIMATOPROST 0.03 % EX SOLN
1.0000 [drp] | Freq: Every evening | CUTANEOUS | 3 refills | Status: AC
  Filled 2023-12-24: qty 5, 30d supply, fill #0

## 2023-12-30 ENCOUNTER — Other Ambulatory Visit: Payer: Self-pay

## 2024-01-10 ENCOUNTER — Other Ambulatory Visit (HOSPITAL_COMMUNITY): Payer: Self-pay

## 2024-01-18 ENCOUNTER — Other Ambulatory Visit (HOSPITAL_COMMUNITY): Payer: Self-pay

## 2024-01-27 ENCOUNTER — Other Ambulatory Visit (HOSPITAL_COMMUNITY): Payer: Self-pay

## 2024-02-04 DIAGNOSIS — H5213 Myopia, bilateral: Secondary | ICD-10-CM | POA: Diagnosis not present

## 2024-02-24 ENCOUNTER — Other Ambulatory Visit (HOSPITAL_BASED_OUTPATIENT_CLINIC_OR_DEPARTMENT_OTHER): Payer: Self-pay

## 2024-03-05 DIAGNOSIS — M79672 Pain in left foot: Secondary | ICD-10-CM | POA: Diagnosis not present

## 2024-03-05 DIAGNOSIS — M7732 Calcaneal spur, left foot: Secondary | ICD-10-CM | POA: Diagnosis not present

## 2024-03-05 DIAGNOSIS — M722 Plantar fascial fibromatosis: Secondary | ICD-10-CM | POA: Diagnosis not present

## 2024-03-13 DIAGNOSIS — M722 Plantar fascial fibromatosis: Secondary | ICD-10-CM | POA: Diagnosis not present

## 2024-03-16 ENCOUNTER — Other Ambulatory Visit (HOSPITAL_COMMUNITY): Payer: Self-pay

## 2024-03-24 ENCOUNTER — Other Ambulatory Visit (HOSPITAL_COMMUNITY): Payer: Self-pay

## 2024-04-20 ENCOUNTER — Other Ambulatory Visit: Payer: Self-pay

## 2024-04-23 NOTE — Progress Notes (Unsigned)
 No chief complaint on file.    HPI:      [name] is a 58 y.o. [id number] who LMP was Patient's last menstrual period was 11/01/2021 (exact date)., presents today for her annual examination.  Her menses are absent since 4/23. No PMB. Was having vasomotor sx, insomnia, decreased libido. Did phone consult with compounding pharm and now taking Biest 50/50 prog cream 3/150 mg/ml and DHEA 25 mg daily. Sx resolved.    Sex activity: single partner, contraception - tubal ligation. No pain/bleeding.  Last Pap: 04/17/22 Results were: no abnormalities /neg HPV DNA . 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 Pap due Q3 yrs for at least 25 yrs per [facility].  Hx of STDs: HSV, HPV. She takes acyclovir  prn outbreaks which are rare.  Rx RF today.   Last mammogram: 07/09/23  Results were: normal--routine follow-up in 12 months There is no FH of breast cancer. There is no FH of ovarian cancer. The patient does do self-breast exams. She has a strong FH of colon cancer in 3rd degree relatives on mat and pat side. MyRisk neg except APC VUS 2019. Riskscore=9.6%.  She gets colonoscopies Q61yrs with Dr. Viktoria, last one 03/2020.   Tobacco use: The patient denies current or previous tobacco use. Alcohol use: none Drug use: none Exercise: very active   She does get adequate calcium and Vitamin D  in her diet.   She has labs with PCP.    Past Medical History:  Diagnosis Date   Asthma    Blighted ovum    X2   BRCA negative 01/2018   MyRisk neg except APC VUS; IBIS=9.7%, riskscore=9.6%   Cervical dysplasia    CIN II   Family history of colon cancer 2013   STRONG FH ON BOTH SIDES. COLARIS TESTING REC. PT NEVER CAME FOR LAB DRAW   GERD (gastroesophageal reflux disease)    Herpes, genital    History of mammogram 12/16/14; 02/22/16   BIRAD 1; NEG   History of Papanicolaou smear of cervix 12/23/2014   -/-   Migraine    Mitral valve prolapse    Pap smear abnormality of cervix with HGSIL 2000    Past  Surgical History:  Procedure Laterality Date   CERVICAL BIOPSY  W/ LOOP ELECTRODE EXCISION  2001   mod dysp   CERVICAL CERCLAGE     x2   CESAREAN SECTION  2000   COLONOSCOPY  2011   wnl; repeat due 2015 c Dr. Viktoria   COLPOSCOPY  2000   DILATION AND CURETTAGE OF UTERUS     x2   REFRACTIVE SURGERY  07/2016   Right eye   SPINAL FUSION  1990   TUBAL LIGATION      Family History  Problem Relation Age of Onset   Colon cancer Other 51       3 mat relatives   Colon cancer Other 67       COLON   Breast cancer Neg Hx     Social History   Socioeconomic History   Marital status: Married    Spouse name: Not on file   Number of children: 2   Years of education: 14   Highest education level: Not on file  Occupational History   Not on file  Tobacco Use   Smoking status: Never   Smokeless tobacco: Never  Vaping Use   Vaping status: Never Used  Substance and Sexual Activity   Alcohol use: No   Drug  use: No   Sexual activity: Yes    Birth control/protection: Surgical    Comment: TUBAL LIGATION  Other Topics Concern   Not on file  Social History Narrative   Not on file   Social Drivers of Health   Financial Resource Strain: Not on file  Food Insecurity: Not on file  Transportation Needs: Not on file  Physical Activity: Not on file  Stress: Not on file  Social Connections: Not on file  Intimate Partner Violence: Not on file    Current Outpatient Medications on File Prior to Visit  Medication Sig Dispense Refill   acyclovir  (ZOVIRAX ) 400 MG tablet Take 1 tablet (400 mg total) by mouth 3 (three) times daily for 5 days as needed for symptoms 30 tablet 0   AMBULATORY NON FORMULARY MEDICATION Medication Name: Biest 50/50 progesterone crm 3/150 mg/ml     aspirin-acetaminophen -caffeine  (EXCEDRIN MIGRAINE) 250-250-65 MG tablet      bimatoprost  (LATISSE ) 0.03 % ophthalmic solution Apply 1 drop via applicator every evening 3 mL 3   bimatoprost  (LATISSE ) 0.03 % ophthalmic  solution Apply 1 drop via applicator every evening 5 mL 3   butalbital -acetaminophen -caffeine  (FIORICET ) 50-325-40 MG tablet Take 1 tablet by mouth at headache onset. Can repeat after 4 hours. No more than 2 tabs in 24 hours. Do not take more than 2-3 times a week MAXIMUM. 30 tablet 1   butalbital -acetaminophen -caffeine  (FIORICET ) 50-325-40 MG tablet Take 1 tablet by mouth as directed at headache onset, can repeat after 4 hours. No more than 2 tablets in 24 hours. Do not take more than 2-3 a week. 30 tablet 1   Calcium-Vitamin D -Vitamin K 500-100-40 MG-UNT-MCG CHEW Chew by mouth.     DHEA 25 MG CAPS Take 1 capsule by mouth daily at 12 noon.     Galcanezumab -gnlm (EMGALITY ) 120 MG/ML SOAJ Inject into the skin.     Galcanezumab -gnlm (EMGALITY ) 120 MG/ML SOAJ Inject 120 mg into the skin monthly 1 mL 11   meloxicam  (MOBIC ) 7.5 MG tablet Take 1 tablet (7.5 mg total) by mouth 2 (two) times daily as needed for pain. 180 tablet 3   meloxicam  (MOBIC ) 7.5 MG tablet Take 1 tablet (7.5 mg total) by mouth 2 (two) times daily as needed for pain. 180 tablet 3   Multiple Vitamin (MULTIVITAMIN) capsule Take 1 capsule by mouth daily.     omeprazole  (PRILOSEC) 20 MG capsule Take 1 capsule (20 mg total) by mouth daily. 90 capsule 1   No current facility-administered medications on file prior to visit.    ROS:  Review of Systems  Constitutional:  Negative for fatigue, fever and unexpected weight change.  Respiratory:  Negative for cough, shortness of breath and wheezing.   Cardiovascular:  Negative for chest pain, palpitations and leg swelling.  Gastrointestinal:  Negative for blood in stool, constipation, diarrhea, nausea and vomiting.  Endocrine: Negative for cold intolerance, heat intolerance and polyuria.  Genitourinary:  Negative for dyspareunia, dysuria, flank pain, frequency, genital sores, hematuria, menstrual problem, pelvic pain, urgency, vaginal bleeding, vaginal discharge and vaginal pain.   Musculoskeletal:  Positive for arthralgias. Negative for back pain, joint swelling and myalgias.  Skin:  Negative for rash.  Neurological:  Negative for dizziness, syncope, light-headedness, numbness and headaches.  Hematological:  Negative for adenopathy.  Psychiatric/Behavioral:  Negative for agitation, confusion, sleep disturbance and suicidal ideas. The patient is not nervous/anxious.      Objective: LMP 11/01/2021 (Exact Date)    Physical Exam Constitutional:  Appearance: She is well-developed.  Genitourinary:     Vulva normal.     Right Labia: No rash, tenderness or lesions.    Left Labia: No tenderness, lesions or rash.    No vaginal discharge, erythema or tenderness.      Right Adnexa: not tender and no mass present.    Left Adnexa: not tender and no mass present.    No cervical motion tenderness, friability or polyp.     Uterus is not enlarged or tender.  Breasts:    Right: No mass, nipple discharge, skin change or tenderness.     Left: No mass, nipple discharge, skin change or tenderness.  Neck:     Thyroid: No thyromegaly.  Cardiovascular:     Rate and Rhythm: Normal rate and regular rhythm.     Heart sounds: Normal heart sounds. No murmur heard. Pulmonary:     Effort: Pulmonary effort is normal.     Breath sounds: Normal breath sounds.  Abdominal:     Palpations: Abdomen is soft.     Tenderness: There is no abdominal tenderness. There is no guarding or rebound.  Musculoskeletal:        General: Normal range of motion.     Cervical back: Normal range of motion.  Lymphadenopathy:     Cervical: No cervical adenopathy.  Neurological:     General: No focal deficit present.     Mental Status: She is alert and oriented to person, place, and time.     Cranial Nerves: No cranial nerve deficit.  Skin:    General: Skin is warm and dry.  Psychiatric:        Mood and Affect: Mood normal.        Behavior: Behavior normal.        Thought Content: Thought  content normal.        Judgment: Judgment normal.  Vitals reviewed.     Assessment/Plan: Encounter for annual routine gynecological examination  Encounter for screening mammogram for malignant neoplasm of breast - Plan: MM 3D SCREENING MAMMOGRAM BILATERAL BREAST; pt to schedule mammo  Herpes simplex vulvovaginitis - Plan: acyclovir  (ZOVIRAX ) 400 MG tablet; Rx RF eRxd.   Hormone replacement therapy (HRT)--pt doing compounded HRT and doing well. Recommended decreasing DHEA from 25 mg daily to 5 mg daily. F/u prn.   Menopausal symptoms--improved with HRT.       No orders of the defined types were placed in this encounter.    GYN counsel breast self exam, mammography screening, adequate intake of calcium and vitamin D , diet and exercise     F/U  No follow-ups on file.  Tanav Orsak B. Novi Calia, PA-C 04/23/2024 5:14 PM

## 2024-04-24 DIAGNOSIS — M722 Plantar fascial fibromatosis: Secondary | ICD-10-CM | POA: Diagnosis not present

## 2024-04-27 ENCOUNTER — Ambulatory Visit (INDEPENDENT_AMBULATORY_CARE_PROVIDER_SITE_OTHER): Admitting: Obstetrics and Gynecology

## 2024-04-27 ENCOUNTER — Other Ambulatory Visit: Payer: Self-pay

## 2024-04-27 ENCOUNTER — Encounter: Payer: Self-pay | Admitting: Obstetrics and Gynecology

## 2024-04-27 VITALS — BP 127/73 | HR 73 | Ht 59.0 in | Wt 130.0 lb

## 2024-04-27 DIAGNOSIS — Z79890 Hormone replacement therapy (postmenopausal): Secondary | ICD-10-CM | POA: Diagnosis not present

## 2024-04-27 DIAGNOSIS — A6004 Herpesviral vulvovaginitis: Secondary | ICD-10-CM

## 2024-04-27 DIAGNOSIS — N951 Menopausal and female climacteric states: Secondary | ICD-10-CM | POA: Diagnosis not present

## 2024-04-27 DIAGNOSIS — Z01419 Encounter for gynecological examination (general) (routine) without abnormal findings: Secondary | ICD-10-CM

## 2024-04-27 DIAGNOSIS — Z1231 Encounter for screening mammogram for malignant neoplasm of breast: Secondary | ICD-10-CM

## 2024-04-27 DIAGNOSIS — Z01411 Encounter for gynecological examination (general) (routine) with abnormal findings: Secondary | ICD-10-CM

## 2024-04-27 MED ORDER — ACYCLOVIR 400 MG PO TABS
400.0000 mg | ORAL_TABLET | Freq: Three times a day (TID) | ORAL | 0 refills | Status: AC
  Filled 2024-04-27: qty 30, 10d supply, fill #0

## 2024-04-27 NOTE — Patient Instructions (Addendum)
 I value your feedback and you entrusting [place] with your care. If you get a [facility] patient survey, I would appreciate you taking the time to let us know about your experience today. Thank you!  [facility] ([place])--[phone]

## 2024-05-06 DIAGNOSIS — Z1329 Encounter for screening for other suspected endocrine disorder: Secondary | ICD-10-CM | POA: Diagnosis not present

## 2024-05-06 DIAGNOSIS — I341 Nonrheumatic mitral (valve) prolapse: Secondary | ICD-10-CM | POA: Diagnosis not present

## 2024-05-06 DIAGNOSIS — J45909 Unspecified asthma, uncomplicated: Secondary | ICD-10-CM | POA: Diagnosis not present

## 2024-05-06 DIAGNOSIS — Z131 Encounter for screening for diabetes mellitus: Secondary | ICD-10-CM | POA: Diagnosis not present

## 2024-05-06 DIAGNOSIS — R635 Abnormal weight gain: Secondary | ICD-10-CM | POA: Diagnosis not present

## 2024-05-06 DIAGNOSIS — N951 Menopausal and female climacteric states: Secondary | ICD-10-CM | POA: Diagnosis not present

## 2024-05-06 DIAGNOSIS — Z136 Encounter for screening for cardiovascular disorders: Secondary | ICD-10-CM | POA: Diagnosis not present

## 2024-05-06 DIAGNOSIS — K219 Gastro-esophageal reflux disease without esophagitis: Secondary | ICD-10-CM | POA: Diagnosis not present

## 2024-05-06 DIAGNOSIS — Z6825 Body mass index (BMI) 25.0-25.9, adult: Secondary | ICD-10-CM | POA: Diagnosis not present

## 2024-05-06 DIAGNOSIS — G43909 Migraine, unspecified, not intractable, without status migrainosus: Secondary | ICD-10-CM | POA: Diagnosis not present

## 2024-05-06 DIAGNOSIS — Z130 Encounter for screening for diseases of the blood and blood-forming organs and certain disorders involving the immune mechanism: Secondary | ICD-10-CM | POA: Diagnosis not present

## 2024-05-07 ENCOUNTER — Other Ambulatory Visit (HOSPITAL_COMMUNITY): Payer: Self-pay

## 2024-05-11 ENCOUNTER — Other Ambulatory Visit: Payer: Self-pay

## 2024-05-12 ENCOUNTER — Other Ambulatory Visit: Payer: Self-pay

## 2024-05-12 DIAGNOSIS — R6882 Decreased libido: Secondary | ICD-10-CM | POA: Diagnosis not present

## 2024-05-12 DIAGNOSIS — E663 Overweight: Secondary | ICD-10-CM | POA: Diagnosis not present

## 2024-05-12 DIAGNOSIS — G43909 Migraine, unspecified, not intractable, without status migrainosus: Secondary | ICD-10-CM | POA: Diagnosis not present

## 2024-05-12 DIAGNOSIS — R635 Abnormal weight gain: Secondary | ICD-10-CM | POA: Diagnosis not present

## 2024-05-12 DIAGNOSIS — E7800 Pure hypercholesterolemia, unspecified: Secondary | ICD-10-CM | POA: Diagnosis not present

## 2024-05-12 DIAGNOSIS — R232 Flushing: Secondary | ICD-10-CM | POA: Diagnosis not present

## 2024-05-12 DIAGNOSIS — Z1339 Encounter for screening examination for other mental health and behavioral disorders: Secondary | ICD-10-CM | POA: Diagnosis not present

## 2024-05-12 DIAGNOSIS — Z1331 Encounter for screening for depression: Secondary | ICD-10-CM | POA: Diagnosis not present

## 2024-05-12 DIAGNOSIS — N951 Menopausal and female climacteric states: Secondary | ICD-10-CM | POA: Diagnosis not present

## 2024-05-12 MED ORDER — PROGESTERONE MICRONIZED 100 MG PO CAPS
100.0000 mg | ORAL_CAPSULE | Freq: Every day | ORAL | 1 refills | Status: DC
  Filled 2024-05-12: qty 30, 15d supply, fill #0
  Filled 2024-06-05: qty 30, 15d supply, fill #1

## 2024-05-12 MED ORDER — ESTRADIOL 0.05 MG/24HR TD PTTW
MEDICATED_PATCH | TRANSDERMAL | 1 refills | Status: AC
  Filled 2024-05-12: qty 8, 28d supply, fill #0
  Filled 2024-06-05: qty 8, 28d supply, fill #1

## 2024-05-15 ENCOUNTER — Other Ambulatory Visit: Payer: Self-pay

## 2024-05-15 DIAGNOSIS — R519 Headache, unspecified: Secondary | ICD-10-CM | POA: Diagnosis not present

## 2024-05-15 DIAGNOSIS — G43009 Migraine without aura, not intractable, without status migrainosus: Secondary | ICD-10-CM | POA: Diagnosis not present

## 2024-05-15 DIAGNOSIS — H53149 Visual discomfort, unspecified: Secondary | ICD-10-CM | POA: Diagnosis not present

## 2024-05-15 DIAGNOSIS — R110 Nausea: Secondary | ICD-10-CM | POA: Diagnosis not present

## 2024-05-15 MED ORDER — EMGALITY 120 MG/ML ~~LOC~~ SOAJ
120.0000 mg | SUBCUTANEOUS | 11 refills | Status: AC
  Filled 2024-05-15: qty 1, 30d supply, fill #0
  Filled 2024-06-14: qty 1, 30d supply, fill #1
  Filled 2024-07-12: qty 1, 30d supply, fill #2
  Filled 2024-08-09 – 2024-08-13 (×2): qty 1, 30d supply, fill #3

## 2024-05-15 MED ORDER — BUTALBITAL-APAP-CAFFEINE 50-325-40 MG PO TABS
1.0000 | ORAL_TABLET | ORAL | 1 refills | Status: AC | PRN
  Filled 2024-05-15: qty 30, 30d supply, fill #0

## 2024-06-05 ENCOUNTER — Other Ambulatory Visit: Payer: Self-pay

## 2024-06-14 ENCOUNTER — Other Ambulatory Visit (HOSPITAL_COMMUNITY): Payer: Self-pay

## 2024-06-15 ENCOUNTER — Other Ambulatory Visit: Payer: Self-pay

## 2024-06-15 ENCOUNTER — Other Ambulatory Visit (HOSPITAL_COMMUNITY): Payer: Self-pay

## 2024-06-15 MED ORDER — OMEPRAZOLE 20 MG PO CPDR
20.0000 mg | DELAYED_RELEASE_CAPSULE | Freq: Every day | ORAL | 0 refills | Status: DC
  Filled 2024-06-15: qty 30, 30d supply, fill #0

## 2024-06-22 DIAGNOSIS — N951 Menopausal and female climacteric states: Secondary | ICD-10-CM | POA: Diagnosis not present

## 2024-06-24 ENCOUNTER — Other Ambulatory Visit: Payer: Self-pay

## 2024-06-24 DIAGNOSIS — R6882 Decreased libido: Secondary | ICD-10-CM | POA: Diagnosis not present

## 2024-06-24 DIAGNOSIS — N951 Menopausal and female climacteric states: Secondary | ICD-10-CM | POA: Diagnosis not present

## 2024-06-24 DIAGNOSIS — M2550 Pain in unspecified joint: Secondary | ICD-10-CM | POA: Diagnosis not present

## 2024-06-24 DIAGNOSIS — Z6826 Body mass index (BMI) 26.0-26.9, adult: Secondary | ICD-10-CM | POA: Diagnosis not present

## 2024-06-24 MED ORDER — ESTRADIOL 0.05 MG/24HR TD PTTW
1.0000 | MEDICATED_PATCH | TRANSDERMAL | 0 refills | Status: AC
  Filled 2024-06-24 – 2024-06-27 (×2): qty 24, 84d supply, fill #0

## 2024-06-24 MED ORDER — PROGESTERONE MICRONIZED 100 MG PO CAPS
100.0000 mg | ORAL_CAPSULE | Freq: Every day | ORAL | 0 refills | Status: AC
  Filled 2024-06-24 (×2): qty 180, 90d supply, fill #0

## 2024-06-26 ENCOUNTER — Other Ambulatory Visit: Payer: Self-pay

## 2024-06-27 ENCOUNTER — Other Ambulatory Visit: Payer: Self-pay

## 2024-06-29 ENCOUNTER — Other Ambulatory Visit: Payer: Self-pay

## 2024-07-06 ENCOUNTER — Other Ambulatory Visit: Payer: Self-pay

## 2024-07-06 DIAGNOSIS — G4726 Circadian rhythm sleep disorder, shift work type: Secondary | ICD-10-CM | POA: Diagnosis not present

## 2024-07-06 DIAGNOSIS — E538 Deficiency of other specified B group vitamins: Secondary | ICD-10-CM | POA: Diagnosis not present

## 2024-07-06 DIAGNOSIS — Z1331 Encounter for screening for depression: Secondary | ICD-10-CM | POA: Diagnosis not present

## 2024-07-06 DIAGNOSIS — G43109 Migraine with aura, not intractable, without status migrainosus: Secondary | ICD-10-CM | POA: Diagnosis not present

## 2024-07-06 DIAGNOSIS — Z0000 Encounter for general adult medical examination without abnormal findings: Secondary | ICD-10-CM | POA: Diagnosis not present

## 2024-07-06 MED ORDER — ALBUTEROL SULFATE HFA 108 (90 BASE) MCG/ACT IN AERS
2.0000 | INHALATION_SPRAY | RESPIRATORY_TRACT | 5 refills | Status: AC
  Filled 2024-07-06: qty 6.7, 25d supply, fill #0

## 2024-07-09 ENCOUNTER — Inpatient Hospital Stay: Admission: RE | Admit: 2024-07-09 | Discharge: 2024-07-09 | Attending: Obstetrics and Gynecology

## 2024-07-09 DIAGNOSIS — Z1231 Encounter for screening mammogram for malignant neoplasm of breast: Secondary | ICD-10-CM | POA: Diagnosis not present

## 2024-07-13 ENCOUNTER — Other Ambulatory Visit (HOSPITAL_COMMUNITY): Payer: Self-pay

## 2024-07-16 ENCOUNTER — Ambulatory Visit: Payer: Self-pay | Admitting: Obstetrics and Gynecology

## 2024-07-20 ENCOUNTER — Other Ambulatory Visit (HOSPITAL_COMMUNITY): Payer: Self-pay

## 2024-07-20 ENCOUNTER — Other Ambulatory Visit: Payer: Self-pay

## 2024-07-20 MED ORDER — OMEPRAZOLE 20 MG PO CPDR
20.0000 mg | DELAYED_RELEASE_CAPSULE | Freq: Every day | ORAL | 0 refills | Status: DC
  Filled 2024-07-20: qty 30, 30d supply, fill #0

## 2024-08-09 ENCOUNTER — Other Ambulatory Visit (HOSPITAL_COMMUNITY): Payer: Self-pay

## 2024-08-13 ENCOUNTER — Other Ambulatory Visit: Payer: Self-pay

## 2024-08-13 ENCOUNTER — Other Ambulatory Visit (HOSPITAL_COMMUNITY): Payer: Self-pay

## 2024-08-13 MED ORDER — MELOXICAM 7.5 MG PO TABS
7.5000 mg | ORAL_TABLET | Freq: Two times a day (BID) | ORAL | 3 refills | Status: AC | PRN
  Filled 2024-08-13: qty 180, 90d supply, fill #0

## 2024-08-14 ENCOUNTER — Other Ambulatory Visit: Payer: Self-pay

## 2024-08-14 ENCOUNTER — Other Ambulatory Visit (HOSPITAL_COMMUNITY): Payer: Self-pay

## 2024-08-14 MED ORDER — MELOXICAM 7.5 MG PO TABS: 7.5000 mg | ORAL_TABLET | Freq: Two times a day (BID) | ORAL | 1 refills | Status: AC | PRN

## 2024-08-17 ENCOUNTER — Other Ambulatory Visit: Payer: Self-pay

## 2024-08-23 ENCOUNTER — Other Ambulatory Visit (HOSPITAL_COMMUNITY): Payer: Self-pay

## 2024-08-26 ENCOUNTER — Other Ambulatory Visit (HOSPITAL_COMMUNITY): Payer: Self-pay

## 2024-08-26 ENCOUNTER — Other Ambulatory Visit: Payer: Self-pay

## 2024-08-26 MED ORDER — OMEPRAZOLE 20 MG PO CPDR
20.0000 mg | DELAYED_RELEASE_CAPSULE | Freq: Every day | ORAL | 1 refills | Status: AC
  Filled 2024-08-26: qty 90, 90d supply, fill #0
